# Patient Record
Sex: Female | Born: 1950 | Race: White | Hispanic: No | Marital: Single | Smoking: Former smoker
Health system: Southern US, Community
[De-identification: ages and names within clinical notes are randomized; demographics above are authoritative.]

## PROBLEM LIST (undated history)

## (undated) DIAGNOSIS — R0600 Dyspnea, unspecified: Secondary | ICD-10-CM

## (undated) DIAGNOSIS — H9190 Unspecified hearing loss, unspecified ear: Secondary | ICD-10-CM

## (undated) DIAGNOSIS — M1990 Unspecified osteoarthritis, unspecified site: Secondary | ICD-10-CM

## (undated) DIAGNOSIS — K219 Gastro-esophageal reflux disease without esophagitis: Secondary | ICD-10-CM

## (undated) DIAGNOSIS — G4730 Sleep apnea, unspecified: Secondary | ICD-10-CM

## (undated) DIAGNOSIS — F32A Depression, unspecified: Secondary | ICD-10-CM

## (undated) HISTORY — DX: Depression, unspecified: F32.A

## (undated) NOTE — Telephone Encounter (Signed)
 Formatting of this note might be different from the original. Pt called and wanted a prescription for supplies.  Pt has not been seen here in over 2 years and now lives in [place] .  Recommended that the pt establish with a Sleep provider and she can contact her PCP to see if they are willing to sign a prescription for supplies. Electronically signed by Arzella America, RT at 02/07/2024 10:08 AM CDT

---

## 1987-07-06 HISTORY — PX: EXPLORATORY LAPAROTOMY: SUR591

## 2015-05-19 DIAGNOSIS — Z9109 Other allergy status, other than to drugs and biological substances: Secondary | ICD-10-CM | POA: Insufficient documentation

## 2015-07-18 LAB — HM COLONOSCOPY

## 2015-08-05 DIAGNOSIS — Z1211 Encounter for screening for malignant neoplasm of colon: Secondary | ICD-10-CM | POA: Insufficient documentation

## 2015-08-05 LAB — HM COLONOSCOPY

## 2020-07-17 DIAGNOSIS — Z87891 Personal history of nicotine dependence: Secondary | ICD-10-CM | POA: Insufficient documentation

## 2020-07-17 DIAGNOSIS — H9190 Unspecified hearing loss, unspecified ear: Secondary | ICD-10-CM | POA: Insufficient documentation

## 2020-07-17 DIAGNOSIS — R0683 Snoring: Secondary | ICD-10-CM | POA: Insufficient documentation

## 2020-07-17 DIAGNOSIS — K635 Polyp of colon: Secondary | ICD-10-CM | POA: Insufficient documentation

## 2020-07-20 DIAGNOSIS — S82891D Other fracture of right lower leg, subsequent encounter for closed fracture with routine healing: Secondary | ICD-10-CM | POA: Insufficient documentation

## 2020-07-21 LAB — HM MAMMOGRAPHY

## 2020-08-04 HISTORY — PX: BREAST BIOPSY: SHX20

## 2020-08-06 DIAGNOSIS — N6019 Diffuse cystic mastopathy of unspecified breast: Secondary | ICD-10-CM | POA: Insufficient documentation

## 2020-10-31 DIAGNOSIS — I493 Ventricular premature depolarization: Secondary | ICD-10-CM | POA: Insufficient documentation

## 2021-09-16 ENCOUNTER — Other Ambulatory Visit: Payer: Self-pay | Admitting: Physician Assistant

## 2021-09-16 DIAGNOSIS — M12811 Other specific arthropathies, not elsewhere classified, right shoulder: Secondary | ICD-10-CM

## 2021-09-17 ENCOUNTER — Other Ambulatory Visit: Payer: Self-pay

## 2021-09-17 ENCOUNTER — Ambulatory Visit
Admission: RE | Admit: 2021-09-17 | Discharge: 2021-09-17 | Disposition: A | Discharge status: Home / Self Care | Payer: Medicare Other | Source: Ambulatory Visit | Attending: Physician Assistant | Admitting: Physician Assistant

## 2021-09-17 DIAGNOSIS — M12811 Other specific arthropathies, not elsewhere classified, right shoulder: Secondary | ICD-10-CM | POA: Diagnosis not present

## 2021-09-28 ENCOUNTER — Telehealth: Payer: Self-pay

## 2021-09-28 NOTE — Telephone Encounter (Signed)
Copied from CRM [id number]. Topic: Appointment Scheduling - Scheduling Inquiry for Clinic ?>> Sep 28, 2021 11:32 AM Elliot Gault wrote: ?Patient states due to a Book It glitch it is not fair her 10/22/2021 NPA has to be [facility] and would like to know if she can be worked in sooner then July. Patient has been placed on the wait list and apologized for the inconvenience ?

## 2021-10-22 ENCOUNTER — Ambulatory Visit: Payer: Self-pay | Admitting: Family Medicine

## 2021-10-28 DIAGNOSIS — M75121 Complete rotator cuff tear or rupture of right shoulder, not specified as traumatic: Secondary | ICD-10-CM | POA: Insufficient documentation

## 2021-10-28 DIAGNOSIS — M7581 Other shoulder lesions, right shoulder: Secondary | ICD-10-CM | POA: Insufficient documentation

## 2021-12-01 ENCOUNTER — Other Ambulatory Visit: Payer: Self-pay | Admitting: Surgery

## 2021-12-15 ENCOUNTER — Encounter
Admission: RE | Admit: 2021-12-15 | Discharge: 2021-12-15 | Disposition: A | Discharge status: Home / Self Care | Payer: Medicare Other | Source: Ambulatory Visit | Attending: Surgery | Admitting: Surgery

## 2021-12-15 ENCOUNTER — Other Ambulatory Visit: Payer: Self-pay

## 2021-12-15 DIAGNOSIS — I10 Essential (primary) hypertension: Secondary | ICD-10-CM

## 2021-12-15 HISTORY — DX: Dyspnea, unspecified: R06.00

## 2021-12-15 HISTORY — DX: Sleep apnea, unspecified: G47.30

## 2021-12-15 HISTORY — DX: Gastro-esophageal reflux disease without esophagitis: K21.9

## 2021-12-15 NOTE — Patient Instructions (Addendum)
Your procedure is scheduled on: 6/20 /2023 Report to the Registration Desk on the 1st floor of the [facility]. To find out your arrival time, please call [phone] between 1PM - 3PM on: 12/21/2021 If your arrival time is 6:00 am, do not arrive prior to that time as the [facility] entrance doors do not open until 6:00 am.  REMEMBER: Instructions that are not followed completely may result in serious medical risk, up to and including death; or upon the discretion of your surgeon and anesthesiologist your surgery may need to be rescheduled.  Do not eat food after midnight the night before surgery.  No gum chewing, lozengers or hard candies.  You may however, drink CLEAR liquids up to 2 hours before you are scheduled to arrive for your surgery. Do not drink anything within 2 hours of your scheduled arrival time.  Clear liquids include: - water  - apple juice without pulp - gatorade (not RED colors) - black coffee or tea (Do NOT add milk or creamers to the coffee or tea) Do NOT drink anything that is not on this list.  In addition, your doctor has ordered for you to drink the provided  Ensure Pre-Surgery Clear Carbohydrate Drink   Drinking this carbohydrate drink up to two hours before surgery helps to reduce insulin resistance and improve patient outcomes. Please complete drinking 2 hours prior to scheduled arrival time.  TAKE THESE MEDICATIONS THE MORNING OF SURGERY WITH A SIP OF WATER: claritin   Use inhalers on the day of surgery and bring to the hospital.  Follow the instructions given by your surgeon about Aspirin.  One week prior to surgery: Stop Anti-inflammatories (NSAIDS) such as Advil, Aleve, Ibuprofen, Motrin, Naproxen, Naprosyn and Aspirin based products such as Excedrin, Goodys Powder, BC Powder and Mobic (meloxicam) Stop ANY OVER THE COUNTER supplements until after surgery like Vit D, fish oil, multivitamins, garlic, You may however, continue to take Tylenol if  needed for pain up until the day of surgery.  No Alcohol for 24 hours before or after surgery.  No Smoking including e-cigarettes for 24 hours prior to surgery.  No chewable tobacco products for at least 6 hours prior to surgery.  No nicotine patches on the day of surgery.  Do not use any "recreational" drugs for at least a week prior to your surgery.  Please be advised that the combination of cocaine and anesthesia may have negative outcomes, up to and including death. If you test positive for cocaine, your surgery will be cancelled.  On the morning of surgery brush your teeth with toothpaste and water, you may rinse your mouth with mouthwash if you wish. Do not swallow any toothpaste or mouthwash.  Use CHG Soap as directed on instruction sheet-provided for you  Do not wear jewelry, make-up, hairpins, clips or nail polish.  Do not wear lotions, powders, or perfumes.   Do not shave body from the neck down 48 hours prior to surgery just in case you cut yourself which could leave a site for infection.  Also, freshly shaved skin may become irritated if using the CHG soap.  Contact lenses, hearing aids and dentures may not be worn into surgery.  Do not bring valuables to the hospital. [facility] is not responsible for any missing/lost belongings or valuables.   Bring your C-PAP to the hospital with you in case you may have to spend the night.   Notify your doctor if there is any change in your medical condition (  cold, fever, infection).  Wear comfortable clothing (specific to your surgery type) to the hospital.   After surgery, you can help prevent lung complications by doing breathing exercises.  Take deep breaths and cough every 1-2 hours. Your doctor may order a device called an Incentive Spirometer to help you take deep breaths. If you are being admitted to the hospital overnight, leave your suitcase in the car. After surgery it may be brought to your room.  If you are being  discharged the day of surgery, you will not be allowed to drive home. You will need a responsible adult (18 years or older) to drive you home and stay with you that night.   If you are taking public transportation, you will need to have a responsible adult (18 years or older) with you. Please confirm with your physician that it is acceptable to use public transportation.   Please call the Pre-admissions Testing Dept. at [phone] if you have any questions about these instructions.  Surgery Visitation Policy:  Patients undergoing a surgery or procedure may have two family members or support persons with them as long as the person is not COVID-19 positive or experiencing its symptoms.

## 2021-12-16 ENCOUNTER — Encounter
Admission: RE | Admit: 2021-12-16 | Discharge: 2021-12-16 | Disposition: A | Discharge status: Home / Self Care | Payer: Medicare Other | Source: Ambulatory Visit | Attending: Surgery | Admitting: Surgery

## 2021-12-16 ENCOUNTER — Encounter: Payer: Self-pay | Admitting: Urgent Care

## 2021-12-16 DIAGNOSIS — I10 Essential (primary) hypertension: Secondary | ICD-10-CM | POA: Insufficient documentation

## 2021-12-16 DIAGNOSIS — Z01818 Encounter for other preprocedural examination: Secondary | ICD-10-CM | POA: Insufficient documentation

## 2021-12-16 LAB — CBC
HCT: 42.8 % (ref 36.0–46.0)
Hemoglobin: 13.8 g/dL (ref 12.0–15.0)
MCH: 30.1 pg (ref 26.0–34.0)
MCHC: 32.2 g/dL (ref 30.0–36.0)
MCV: 93.4 fL (ref 80.0–100.0)
Platelets: 261 10*3/uL (ref 150–400)
RBC: 4.58 MIL/uL (ref 3.87–5.11)
RDW: 13 % (ref 11.5–15.5)
WBC: 7 10*3/uL (ref 4.0–10.5)
nRBC: 0 % (ref 0.0–0.2)

## 2021-12-17 ENCOUNTER — Ambulatory Visit: Payer: Self-pay | Admitting: Family Medicine

## 2021-12-21 MED ORDER — CHLORHEXIDINE GLUCONATE 0.12 % MT SOLN: 15.0000 mL | Freq: Once | OROMUCOSAL | Status: AC

## 2021-12-21 MED ORDER — ORAL CARE MOUTH RINSE: 15.0000 mL | Freq: Once | OROMUCOSAL | Status: AC

## 2021-12-21 MED ORDER — FAMOTIDINE 20 MG PO TABS: 20.0000 mg | ORAL_TABLET | Freq: Once | ORAL | Status: AC

## 2021-12-21 MED ORDER — LACTATED RINGERS IV SOLN: INTRAVENOUS | Status: DC

## 2021-12-21 MED ORDER — VANCOMYCIN HCL IN DEXTROSE 1-5 GM/200ML-% IV SOLN
1000.0000 mg | INTRAVENOUS | Status: AC
  Administered 2021-12-22: 1000 mg via INTRAVENOUS

## 2021-12-22 ENCOUNTER — Other Ambulatory Visit: Payer: Self-pay

## 2021-12-22 ENCOUNTER — Encounter: Payer: Self-pay | Admitting: Surgery

## 2021-12-22 ENCOUNTER — Encounter
Admission: RE | Disposition: A | Discharge status: Home / Self Care | Payer: Self-pay | Source: Home / Self Care | Attending: Surgery

## 2021-12-22 ENCOUNTER — Ambulatory Visit: Payer: Medicare Other

## 2021-12-22 ENCOUNTER — Ambulatory Visit
Admission: RE | Admit: 2021-12-22 | Discharge: 2021-12-22 | Disposition: A | Discharge status: Home / Self Care | Payer: Medicare Other | Attending: Surgery | Admitting: Surgery

## 2021-12-22 ENCOUNTER — Ambulatory Visit: Payer: Medicare Other | Admitting: Certified Registered"

## 2021-12-22 DIAGNOSIS — M75121 Complete rotator cuff tear or rupture of right shoulder, not specified as traumatic: Secondary | ICD-10-CM | POA: Insufficient documentation

## 2021-12-22 DIAGNOSIS — M25811 Other specified joint disorders, right shoulder: Secondary | ICD-10-CM | POA: Diagnosis present

## 2021-12-22 DIAGNOSIS — G4730 Sleep apnea, unspecified: Secondary | ICD-10-CM | POA: Diagnosis not present

## 2021-12-22 DIAGNOSIS — Z87891 Personal history of nicotine dependence: Secondary | ICD-10-CM | POA: Insufficient documentation

## 2021-12-22 DIAGNOSIS — K219 Gastro-esophageal reflux disease without esophagitis: Secondary | ICD-10-CM | POA: Diagnosis not present

## 2021-12-22 HISTORY — PX: SHOULDER ARTHROSCOPY WITH SUBACROMIAL DECOMPRESSION, ROTATOR CUFF REPAIR AND BICEP TENDON REPAIR: SHX5687

## 2021-12-22 SURGERY — SHOULDER ARTHROSCOPY WITH SUBACROMIAL DECOMPRESSION, ROTATOR CUFF REPAIR AND BICEP TENDON REPAIR
Anesthesia: General | Site: Shoulder | Laterality: Right

## 2021-12-22 MED ORDER — KETOROLAC TROMETHAMINE 15 MG/ML IJ SOLN
INTRAMUSCULAR | Status: AC
  Administered 2021-12-22: 15 mg via INTRAVENOUS
  Filled 2021-12-22: qty 1

## 2021-12-22 MED ORDER — VANCOMYCIN HCL IN DEXTROSE 1-5 GM/200ML-% IV SOLN
INTRAVENOUS | Status: AC
  Filled 2021-12-22: qty 200

## 2021-12-22 MED ORDER — OXYCODONE HCL 5 MG PO TABS: 5.0000 mg | ORAL_TABLET | ORAL | 0 refills | Status: DC | PRN

## 2021-12-22 MED ORDER — ACETAMINOPHEN 10 MG/ML IV SOLN
INTRAVENOUS | Status: DC | PRN
  Administered 2021-12-22: 1000 mg via INTRAVENOUS

## 2021-12-22 MED ORDER — OXYCODONE HCL 5 MG PO TABS
5.0000 mg | ORAL_TABLET | Freq: Once | ORAL | Status: AC
  Administered 2021-12-22: 5 mg via ORAL

## 2021-12-22 MED ORDER — FAMOTIDINE 20 MG PO TABS
ORAL_TABLET | ORAL | Status: AC
  Administered 2021-12-22: 20 mg via ORAL
  Filled 2021-12-22: qty 1

## 2021-12-22 MED ORDER — ONDANSETRON HCL 4 MG/2ML IJ SOLN: 4.0000 mg | Freq: Once | INTRAMUSCULAR | Status: DC | PRN

## 2021-12-22 MED ORDER — FENTANYL CITRATE (PF) 100 MCG/2ML IJ SOLN
INTRAMUSCULAR | Status: AC
  Filled 2021-12-22: qty 2

## 2021-12-22 MED ORDER — PHENYLEPHRINE 80 MCG/ML (10ML) SYRINGE FOR IV PUSH (FOR BLOOD PRESSURE SUPPORT)
PREFILLED_SYRINGE | INTRAVENOUS | Status: AC
  Filled 2021-12-22: qty 10

## 2021-12-22 MED ORDER — ONDANSETRON HCL 4 MG/2ML IJ SOLN
INTRAMUSCULAR | Status: AC
  Filled 2021-12-22: qty 2

## 2021-12-22 MED ORDER — BUPIVACAINE LIPOSOME 1.3 % IJ SUSP
INTRAMUSCULAR | Status: AC
  Filled 2021-12-22: qty 10

## 2021-12-22 MED ORDER — EPINEPHRINE PF 1 MG/ML IJ SOLN
INTRAMUSCULAR | Status: AC
  Filled 2021-12-22: qty 2

## 2021-12-22 MED ORDER — MIDAZOLAM HCL 2 MG/2ML IJ SOLN
INTRAMUSCULAR | Status: AC
  Filled 2021-12-22: qty 2

## 2021-12-22 MED ORDER — BUPIVACAINE HCL (PF) 0.5 % IJ SOLN
INTRAMUSCULAR | Status: AC
  Filled 2021-12-22: qty 20

## 2021-12-22 MED ORDER — EPHEDRINE SULFATE (PRESSORS) 50 MG/ML IJ SOLN
INTRAMUSCULAR | Status: DC | PRN
  Administered 2021-12-22 (×2): 5 mg via INTRAVENOUS

## 2021-12-22 MED ORDER — SUGAMMADEX SODIUM 200 MG/2ML IV SOLN
INTRAVENOUS | Status: DC | PRN
  Administered 2021-12-22: 200 mg via INTRAVENOUS

## 2021-12-22 MED ORDER — PROPOFOL 10 MG/ML IV BOLUS
INTRAVENOUS | Status: AC
Start: 2021-12-22 — End: ?
  Filled 2021-12-22: qty 20

## 2021-12-22 MED ORDER — FENTANYL CITRATE (PF) 100 MCG/2ML IJ SOLN: 25.0000 ug | INTRAMUSCULAR | Status: DC | PRN

## 2021-12-22 MED ORDER — PHENYLEPHRINE HCL (PRESSORS) 10 MG/ML IV SOLN
INTRAVENOUS | Status: DC | PRN
  Administered 2021-12-22: 160 ug via INTRAVENOUS
  Administered 2021-12-22: 80 ug via INTRAVENOUS
  Administered 2021-12-22: 160 ug via INTRAVENOUS

## 2021-12-22 MED ORDER — LACTATED RINGERS IR SOLN
Status: DC | PRN
  Administered 2021-12-22: 3001 mL

## 2021-12-22 MED ORDER — KETOROLAC TROMETHAMINE 15 MG/ML IJ SOLN: 15.0000 mg | Freq: Once | INTRAMUSCULAR | Status: AC

## 2021-12-22 MED ORDER — EPHEDRINE 5 MG/ML INJ
INTRAVENOUS | Status: AC
  Filled 2021-12-22: qty 5

## 2021-12-22 MED ORDER — ROCURONIUM BROMIDE 10 MG/ML (PF) SYRINGE
PREFILLED_SYRINGE | INTRAVENOUS | Status: AC
  Filled 2021-12-22: qty 10

## 2021-12-22 MED ORDER — MIDAZOLAM HCL 2 MG/2ML IJ SOLN
INTRAMUSCULAR | Status: AC
  Administered 2021-12-22: 2 mg via INTRAVENOUS
  Filled 2021-12-22: qty 2

## 2021-12-22 MED ORDER — BUPIVACAINE-EPINEPHRINE 0.5% -1:200000 IJ SOLN
INTRAMUSCULAR | Status: DC | PRN
  Administered 2021-12-22: 30 mL

## 2021-12-22 MED ORDER — FENTANYL CITRATE (PF) 100 MCG/2ML IJ SOLN
INTRAMUSCULAR | Status: DC | PRN
  Administered 2021-12-22 (×4): 25 ug via INTRAVENOUS

## 2021-12-22 MED ORDER — DEXAMETHASONE SODIUM PHOSPHATE 10 MG/ML IJ SOLN
INTRAMUSCULAR | Status: DC | PRN
  Administered 2021-12-22: 10 mg via INTRAVENOUS

## 2021-12-22 MED ORDER — BUPIVACAINE LIPOSOME 1.3 % IJ SUSP
INTRAMUSCULAR | Status: DC | PRN
  Administered 2021-12-22: 10 mL via PERINEURAL

## 2021-12-22 MED ORDER — BUPIVACAINE-EPINEPHRINE (PF) 0.5% -1:200000 IJ SOLN
INTRAMUSCULAR | Status: AC
Start: 2021-12-22 — End: ?
  Filled 2021-12-22: qty 30

## 2021-12-22 MED ORDER — ROCURONIUM BROMIDE 100 MG/10ML IV SOLN
INTRAVENOUS | Status: DC | PRN
  Administered 2021-12-22: 10 mg via INTRAVENOUS
  Administered 2021-12-22: 60 mg via INTRAVENOUS

## 2021-12-22 MED ORDER — CHLORHEXIDINE GLUCONATE 0.12 % MT SOLN
OROMUCOSAL | Status: AC
  Administered 2021-12-22: 15 mL via OROMUCOSAL
  Filled 2021-12-22: qty 15

## 2021-12-22 MED ORDER — BUPIVACAINE HCL (PF) 0.5 % IJ SOLN
INTRAMUSCULAR | Status: DC | PRN
  Administered 2021-12-22 (×2): 20 mL via PERINEURAL

## 2021-12-22 MED ORDER — 0.9 % SODIUM CHLORIDE (POUR BTL) OPTIME
TOPICAL | Status: DC | PRN
  Administered 2021-12-22: 1000 mL

## 2021-12-22 MED ORDER — ONDANSETRON HCL 4 MG/2ML IJ SOLN
INTRAMUSCULAR | Status: DC | PRN
  Administered 2021-12-22: 4 mg via INTRAVENOUS

## 2021-12-22 MED ORDER — LIDOCAINE HCL (CARDIAC) PF 100 MG/5ML IV SOSY
PREFILLED_SYRINGE | INTRAVENOUS | Status: DC | PRN
  Administered 2021-12-22: 100 mg via INTRAVENOUS

## 2021-12-22 MED ORDER — OXYCODONE HCL 5 MG PO TABS
ORAL_TABLET | ORAL | Status: AC
  Filled 2021-12-22: qty 1

## 2021-12-22 MED ORDER — PROPOFOL 10 MG/ML IV BOLUS
INTRAVENOUS | Status: DC | PRN
  Administered 2021-12-22: 100 mg via INTRAVENOUS

## 2021-12-22 MED ORDER — LIDOCAINE HCL (PF) 2 % IJ SOLN
INTRAMUSCULAR | Status: AC
  Filled 2021-12-22: qty 5

## 2021-12-22 MED ORDER — MIDAZOLAM HCL 2 MG/2ML IJ SOLN: 2.0000 mg | Freq: Once | INTRAMUSCULAR | Status: AC

## 2021-12-22 MED ORDER — ACETAMINOPHEN 10 MG/ML IV SOLN
INTRAVENOUS | Status: AC
Start: 2021-12-22 — End: ?
  Filled 2021-12-22: qty 100

## 2021-12-22 SURGICAL SUPPLY — 51 items
ANCHOR ALL-SUT Q-FIX 2.8 (Anchor) ×2 IMPLANT
ANCHOR HEALICOIL REGEN 5.5 (Anchor) ×2 IMPLANT
ANCHOR SUT JK SZ 2 2.9 DBL SL (Anchor) ×1 IMPLANT
BIT DRILL JUGRKNT W/NDL BIT2.9 (DRILL) IMPLANT
BLADE FULL RADIUS 3.5 (BLADE) ×2 IMPLANT
BUR ACROMIONIZER 4.0 (BURR) ×2 IMPLANT
CANNULA SHAVER 8MMX76MM (CANNULA) ×2 IMPLANT
CHLORAPREP W/TINT 26 (MISCELLANEOUS) ×2 IMPLANT
COVER MAYO STAND REUSABLE (DRAPES) ×2 IMPLANT
DILATOR 5.5 THREADED HEALICOIL (MISCELLANEOUS) ×1 IMPLANT
DRILL JUGGERKNOT W/NDL BIT 2.9 (DRILL) ×2
ELECT CAUTERY BLADE 6.4 (BLADE) ×2 IMPLANT
ELECT REM PT RETURN 9FT ADLT (ELECTROSURGICAL) ×2
ELECTRODE REM PT RTRN 9FT ADLT (ELECTROSURGICAL) ×1 IMPLANT
GAUZE SPONGE 4X4 12PLY STRL (GAUZE/BANDAGES/DRESSINGS) ×2 IMPLANT
GAUZE XEROFORM 1X8 LF (GAUZE/BANDAGES/DRESSINGS) ×2 IMPLANT
GLOVE BIO SURGEON STRL SZ7.5 (GLOVE) ×4 IMPLANT
GLOVE BIO SURGEON STRL SZ8 (GLOVE) ×4 IMPLANT
GLOVE BIOGEL PI IND STRL 8 (GLOVE) ×1 IMPLANT
GLOVE BIOGEL PI INDICATOR 8 (GLOVE) ×1
GLOVE SURG UNDER LTX SZ8 (GLOVE) ×2 IMPLANT
GOWN STRL REUS W/ TWL LRG LVL3 (GOWN DISPOSABLE) ×1 IMPLANT
GOWN STRL REUS W/ TWL XL LVL3 (GOWN DISPOSABLE) ×1 IMPLANT
GOWN STRL REUS W/TWL LRG LVL3 (GOWN DISPOSABLE) ×1
GOWN STRL REUS W/TWL XL LVL3 (GOWN DISPOSABLE) ×1
GRASPER SUT 15 45D LOW PRO (SUTURE) IMPLANT
IV LACTATED RINGER IRRG 3000ML (IV SOLUTION) ×2
IV LR IRRIG 3000ML ARTHROMATIC (IV SOLUTION) ×2 IMPLANT
KIT CANNULA 8X76-LX IN CANNULA (CANNULA) IMPLANT
KIT SUTURE 2.8 Q-FIX DISP (MISCELLANEOUS) ×1 IMPLANT
MANIFOLD NEPTUNE II (INSTRUMENTS) ×4 IMPLANT
MASK FACE SPIDER DISP (MASK) ×2 IMPLANT
MAT ABSORB  FLUID 56X50 GRAY (MISCELLANEOUS) ×1
MAT ABSORB FLUID 56X50 GRAY (MISCELLANEOUS) ×1 IMPLANT
PACK ARTHROSCOPY SHOULDER (MISCELLANEOUS) ×2 IMPLANT
PAD ABD DERMACEA PRESS 5X9 (GAUZE/BANDAGES/DRESSINGS) ×3 IMPLANT
PASSER SUT FIRSTPASS SELF (INSTRUMENTS) ×1 IMPLANT
SLING ARM LRG DEEP (SOFTGOODS) ×2 IMPLANT
SLING ULTRA II LG (MISCELLANEOUS) ×2 IMPLANT
SPONGE T-LAP 18X18 ~~LOC~~+RFID (SPONGE) ×2 IMPLANT
STAPLER SKIN PROX 35W (STAPLE) ×2 IMPLANT
STRAP SAFETY 5IN WIDE (MISCELLANEOUS) ×2 IMPLANT
SUT ETHIBOND 0 MO6 C/R (SUTURE) ×2 IMPLANT
SUT ULTRABRAID 2 COBRAID 38 (SUTURE) IMPLANT
SUT VIC AB 2-0 CT1 27 (SUTURE) ×2
SUT VIC AB 2-0 CT1 TAPERPNT 27 (SUTURE) ×2 IMPLANT
TAPE MICROFOAM 4IN (TAPE) ×2 IMPLANT
TUBING CONNECTING 10 (TUBING) ×2 IMPLANT
TUBING INFLOW SET DBFLO PUMP (TUBING) ×2 IMPLANT
WAND WEREWOLF FLOW 90D (MISCELLANEOUS) ×2 IMPLANT
WATER STERILE IRR 500ML POUR (IV SOLUTION) ×2 IMPLANT

## 2021-12-22 NOTE — H&P (Signed)
History of Present Illness:  Tara Olson is a 71 y.o. female who presents today for history and physical for an upcoming right shoulder arthroscopy with debridement, decompression, rotator cuff repair, and probable biceps tenodesis to be done by Dr. Joice Lofts on December 22, 2021.  The patient's symptoms began over a year ago and developed without any specific cause or injury. At the time, the patient was living in [place] so she received treatment up there, including a course of physical therapy and several steroid injections which provided temporary partial relief of her symptoms. More recently, she has moved down to the [place] area to be closer to her grandchildren. Because of continued pain in her shoulder, she saw Van Clines, PA, who sent her for an MRI scan of the shoulder and referred her to me for further evaluation and treatment.   The patient describes the symptoms as moderate (patient is active but has had to make modifications or give up activities) and have the quality of being aching, nagging, stabbing and tender. The pain is localized to the lateral arm/shoulder. These symptoms are aggravated with normal daily activities, with sleeping, at higher levels of activity, with overhead activity and reaching behind the back. She has tried acetaminophen and non-steroidal anti-inflammatories (Meloxicam) with temporary partial relief. She has tried physical therapy , rest and ice with limited benefit. She has tried the 2 injections described above, also with temporary partial relief of her symptoms . The patient denies any neck pain, nor does she note any numbness or paresthesias down her arm to her hand. This complaint is not work related. She is a sports non-participant.  Shoulder Surgical History:  The patient has had no shoulder surgery in the past.  PMH/PSH/Family History/Social History/Meds/Allergies:  I have reviewed past medical, surgical, social and family history, medications and  allergies as documented in the EMR.  Current Outpatient Medications: acetaminophen (TYLENOL) 500 MG tablet Take by mouth  albuterol 90 mcg/actuation inhaler Inhale into the lungs every 4 (four) hours as needed  aspirin 81 MG EC tablet Take 81 mg by mouth once daily  calcium carbonate-vitamin D3 (OS-CAL 500+D) 500 mg-10 mcg (400 unit) tablet Take 1 tablet by mouth 2 (two) times daily with meals  cetirizine (ZYRTEC) 10 mg capsule Take 10 mg by mouth once daily  fluticasone propionate (FLONASE) 50 mcg/actuation nasal spray Place 2 sprays into both nostrils once daily  Herbal Supplement garlic  meloxicam (MOBIC) 7.5 MG tablet Take 1 tablet (7.5 mg total) by mouth 2 (two) times daily 60 tablet 2  multivitamin tablet Take 1 tablet by mouth once daily  omega-3s/dha/epa/fish oil (OMEGA 3 ORAL) Take by mouth   Allergies:  Penicillins Anaphylaxis  Hydrocodone-Acetaminophen Other (Psychiatric)  Past Medical History:  Closed fracture dislocation of right ankle (07/20/2020)  Environmental allergies 05/19/2015  Former smoker 07/17/2020  Hearing loss 07/17/2020  Osteoarthritis  Polyp of colon 07/17/2020  PVC's (premature ventricular contractions) 10/31/2020  Sleep apnea (Snoring 07/17/2020)   Past Surgical History:  Laproscopy 07/05/1981  BREAST BIOPSY, NEEDLE CORE Right 2014  Bx done in [place] , negative  COLONOSCOPY WITH POLYPECTOMY 08/05/2015  Tubular adenoma polyp removed. Next colonoscopy advised in 5 yrs.  US BREAST RIGHT BIOPSY Right 08/04/2020  US BREAST RIGHT BIOPSY AFF STO ULTRASOUND  ENDOSCOPY, COLON, DIAGNOSTIC  TONSILLECTOMY & ADENOIDECTOMY   Family History:  High blood pressure (Hypertension) Father  Diabetes type II Father  Coronary Artery Disease (Blocked arteries around heart) Father  Prostate cancer Brother  Coronary Artery Disease (  Blocked arteries around heart) Brother  Sleep apnea Brother  Breast cancer Maternal Aunt  Breast cancer Paternal Aunt   Social History:    Socioeconomic History:  Marital status: Single  Tobacco Use  Smoking status: Former  Types: Cigarettes  Quit date: 04/06/2002  Years since quitting: 19.6  Smokeless tobacco: Never  Vaping Use  Vaping Use: Never used   Review of Systems:  A comprehensive 14 point ROS was performed, reviewed, and the pertinent orthopaedic findings are documented in the HPI.  Physical Exam:  Vitals:  12/09/21 1115  BP: 137/89  Pulse: 81  Weight: 81.2 kg (179 lb)  Height: 162.6 cm (5\' 4" )  PainSc: 0-No pain  PainLoc: Shoulder   General/Constitutional: The patient appears to be well-nourished, well-developed, and in no acute distress. Neuro/Psych: Normal mood and affect, oriented to person, place and time. Eyes: Non-icteric. Pupils are equal, round, and reactive to light, and exhibit synchronous movement. ENT: Unremarkable. Lymphatic: No palpable adenopathy. Respiratory: Lungs clear to auscultation, Normal chest excursion, No wheezes and Non-labored breathing Cardiovascular: Regular rate and rhythm. No murmurs. and No edema, swelling or tenderness, except as noted in detailed exam. Integumentary: No impressive skin lesions present, except as noted in detailed exam. Musculoskeletal: Unremarkable, except as noted in detailed exam.  Heart: Examination of the heart reveals regular, rate, and rhythm. There is no murmur noted on ascultation. There is a normal apical pulse.  Lungs: Lungs are clear to auscultation. There is no wheeze, rhonchi, or crackles. There is normal expansion of bilateral chest walls.   Right shoulder exam: SKIN: normal SWELLING: none WARMTH: none LYMPH NODES: no adenopathy palpable CREPITUS: none TENDERNESS: Mildly tender over anterior shoulder region more so than anterolateral acromial region ROM (active):  Forward flexion: 160 degrees Abduction: 155 degrees Internal rotation: Right PSIS ROM (passive):  Forward flexion: 165 degrees Abduction: 160 degrees  ER/IR at  90 abd: 90 degrees / 50 degrees  She has mild-moderate pain with internal rotation, and mild pain with forward flexion, abduction, and at the extremes of internal and external rotation at 90 degrees of abduction.  STRENGTH: Forward flexion: 4-4+/5 Abduction: 4-4+/5 External rotation: 4-4+/5 Internal rotation: 4+/5 Pain with RC testing: Mild pain with resisted forward flexion and abduction  STABILITY: Normal  SPECIAL TESTS: ' test: positive, mild Speed's test: positive Capsulitis - pain w/ passive ER: no Crossed arm test: no Crank: Not evaluated Anterior apprehension: Negative Posterior apprehension: Not evaluated   She is neurovascularly intact to the right upper extremity.  Shoulder X-Ray Imaging: Recent true AP and Y-scapular views of the right shoulder are available for review and have been reviewed by myself. These films demonstrate mild degenerative changes of the glenohumeral joint as manifest by a small inferior humeral osteophyte. The subacromial space is mildly decreased. There is no subacromial or infra-clavicular spurring. She demonstrates a Type II-III acromion.  Right Shoulder Imaging: MRI Shoulder Cartilage: Partial thickness humeral head cartilage loss. Partial thickness glenoid cartilage loss. MRI Shoulder Rotator Cuff: Full thickness tear of the supraspinatus. No retraction. MRI Shoulder Labrum / Biceps: No labral tear or biceps abormality. MRI Shoulder Bone: Normal bone.  Both the films and report were reviewed by myself and discussed with the patient.  Assessment:  1. Nontraumatic complete tear of right rotator cuff.  2. Rotator cuff tendinitis, right.   Plan:  The treatment options were discussed with the patient. In addition, patient educational materials were provided regarding the diagnosis and treatment options. The patient is quite frustrated by her  symptoms and functional limitations, and is ready to consider more aggressive treatment options.  Therefore, I have recommended a surgical procedure, specifically a right shoulder arthroscopy with debridement, decompression, rotator cuff repair, and probable biceps tenodesis. The procedure was discussed with the patient, as were the potential risks (including bleeding, infection, nerve and/or blood vessel injury, persistent or recurrent pain, failure of the repair, progression of arthritis, need for further surgery, blood clots, strokes, heart attacks and/or arhythmias, pneumonia, etc.) and benefits. The patient states her understanding and wishes to proceed. All of the patient's questions and concerns were answered. She can call any time with further concerns. She will follow up post-surgery, routine.   H&P reviewed and patient re-examined. No changes.

## 2021-12-22 NOTE — Anesthesia Preprocedure Evaluation (Signed)
Anesthesia Evaluation  Patient identified by MRN, date of birth, ID band Patient awake    Reviewed: Allergy & Precautions, H&P , NPO status , Patient's Chart, lab work & pertinent test results, reviewed documented beta blocker date and time   Airway Mallampati: II  TM Distance: >3 FB Neck ROM: full    Dental  (+) Teeth Intact   Pulmonary shortness of breath, sleep apnea ,    Pulmonary exam normal        Cardiovascular Exercise Tolerance: Good negative cardio ROS Normal cardiovascular exam Rhythm:regular Rate:Normal     Neuro/Psych negative neurological ROS  negative psych ROS   GI/Hepatic Neg liver ROS, GERD  Medicated,  Endo/Other  negative endocrine ROS  Renal/GU negative Renal ROS  negative genitourinary   Musculoskeletal   Abdominal   Peds  Hematology negative hematology ROS (+)   Anesthesia Other Findings Past Medical History: No date: Dyspnea No date: GERD (gastroesophageal reflux disease) No date: Sleep apnea History reviewed. No pertinent surgical history. BMI    Body Mass Index: 30.04 kg/m     Reproductive/Obstetrics negative OB ROS                             Anesthesia Physical Anesthesia Plan  ASA: 3  Anesthesia Plan: General ETT   Post-op Pain Management: Regional block*   Induction:   PONV Risk Score and Plan: 4 or greater  Airway Management Planned:   Additional Equipment:   Intra-op Plan:   Post-operative Plan:   Informed Consent: I have reviewed the patients History and Physical, chart, labs and discussed the procedure including the risks, benefits and alternatives for the proposed anesthesia with the patient or authorized representative who has indicated his/her understanding and acceptance.     Dental Advisory Given  Plan Discussed with: CRNA  Anesthesia Plan Comments:         Anesthesia Quick Evaluation

## 2021-12-22 NOTE — Transfer of Care (Signed)
Immediate Anesthesia Transfer of Care Note  Patient: Tara Olson  Procedure(s) Performed: RIGHT SHOULDER ARTHROSCOPY WITH DEBRIDEMENT, DECOMPRESSION, ROTATOR CUFF REPAIR, AND BICEPS TENODESIS (Right: Shoulder)  Patient Location: PACU  Anesthesia Type:General  Level of Consciousness: awake  Airway & Oxygen Therapy: Patient Spontanous Breathing and Patient connected to face mask oxygen  Post-op Assessment: Report given to RN and Post -op Vital signs reviewed and stable  Post vital signs: Reviewed and stable  Last Vitals:  Vitals Value Taken Time  BP 130/63 12/22/21 0931  Temp 35.9 0931  Pulse 88 12/22/21 0938  Resp 15 12/22/21 0938  SpO2 99 % 12/22/21 0938  Vitals shown include unvalidated device data.  Last Pain:  Vitals:   12/22/21 0628  TempSrc: Temporal  PainSc: 0-No pain         Complications: No notable events documented.

## 2021-12-22 NOTE — Discharge Instructions (Addendum)
Orthopedic discharge instructions: Keep dressing dry and intact.  May shower after dressing changed on post-op day #4 (Saturday).  Cover staples with Band-Aids after drying off. Apply ice frequently to shoulder. Take ibuprofen 600-800 mg TID with meals for 7-10 days, then as necessary. Take oxycodone as prescribed when needed.  May supplement with ES Tylenol if necessary. Keep shoulder immobilizer on at all times except may remove for bathing purposes. Follow-up in 10-14 days or as scheduled.AMBULATORY SURGERY  DISCHARGE INSTRUCTIONS   The drugs that you were given will stay in your system until tomorrow so for the next 24 hours you should not:  Drive an automobile Make any legal decisions Drink any alcoholic beverage   You may resume regular meals tomorrow.  Today it is better to start with liquids and gradually work up to solid foods.  You may eat anything you prefer, but it is better to start with liquids, then soup and crackers, and gradually work up to solid foods.   Please notify your doctor immediately if you have any unusual bleeding, trouble breathing, redness and pain at the surgery site, drainage, fever, or pain not relieved by medication.    Additional Instructions:        Please contact your physician with any problems or Same Day Surgery at [phone], Monday through Friday 6 am to 4 pm, or [facility] Main number at [phone].  

## 2021-12-22 NOTE — Anesthesia Procedure Notes (Signed)
Procedure Name: Intubation Date/Time: 12/22/2021 7:45 AM  Performed by: Morene Crocker, CRNAPre-anesthesia Checklist: Patient identified, Patient being monitored, Timeout performed, Emergency Drugs available and Suction available Patient Re-evaluated:Patient Re-evaluated prior to induction Oxygen Delivery Method: Circle system utilized Preoxygenation: Pre-oxygenation with 100% oxygen Induction Type: IV induction Ventilation: Mask ventilation without difficulty Laryngoscope Size: 3 and McGraph Grade View: Grade I Tube type: Oral Tube size: 7.0 mm Number of attempts: 1 Airway Equipment and Method: Stylet Placement Confirmation: ETT inserted through vocal cords under direct vision, positive ETCO2 and breath sounds checked- equal and bilateral Secured at: 22 cm Tube secured with: Tape Dental Injury: Teeth and Oropharynx as per pre-operative assessment

## 2021-12-22 NOTE — Anesthesia Procedure Notes (Signed)
Anesthesia Regional Block: Interscalene brachial plexus block   Pre-Anesthetic Checklist: , timeout performed,  Correct Patient, Correct Site, Correct Laterality,  Correct Procedure, Correct Position, site marked,  Risks and benefits discussed,  Surgical consent,  Pre-op evaluation,  At surgeon's request and post-op pain management  Laterality: Upper and Right  Prep: chloraprep       Needles:  Injection technique: Single-shot  Needle Type: Echogenic Stimulator Needle     Needle Length: 10cm  Needle Gauge: 22     Additional Needles:   Procedures:,,,, ultrasound used (permanent image in chart),,    Narrative:  Start time: 12/22/2021 7:31 AM End time: 12/22/2021 7:33 AM  Performed by: Personally  Anesthesiologist: Yevette Edwards, MD  Additional Notes: Pt. Identified and accepting of procedure after risks and benefits fully reviewed and questions answered. Time out performed and laterality confirmed prior to procedure.  ISNB  performed without difficulty and well tolerated.  Neg IV and SATD.  No pain on injection of Local anesthetic and VSST.

## 2021-12-22 NOTE — Op Note (Signed)
12/22/2021  9:43 AM  Patient:   Tara Olson  Pre-Op Diagnosis:   Impingement/tendinopathy with rotator cuff tear, right shoulder.  Post-Op Diagnosis:   Impingement/tendinopathy with rotator cuff tear and biceps tendinopathy, right shoulder.  Procedure:   Limited arthroscopic debridement, arthroscopic subacromial decompression, mini-open rotator cuff repair, and mini-open biceps tenodesis, right shoulder.  Anesthesia:   General endotracheal with interscalene block using Exparel placed preoperatively by the anesthesiologist.  Surgeon:   Maryagnes Amos, MD  Assistant:   Horris Latino, PA-C  Findings:   As above.  There was a full-thickness tear involving the anterior and middle insertional fibers of the supraspinatus tendon.  The remainder of the rotator cuff was in satisfactory condition.  There was some moderate "lip sticking" of the biceps tendon without partial or full-thickness tearing.  The articular surfaces of the glenoid and humerus both were in satisfactory condition, as well as the glenoid labrum.  Complications:   None  Fluids:   500 cc  Estimated blood loss:   10 cc  Tourniquet time:   None  Drains:   None  Closure:   Staples      Brief clinical note:   The patient is a 71 year old female with a history of progressively worsening right shoulder pain. The patient's symptoms have progressed despite medications, activity modification, etc. The patient's history and examination are consistent with impingement/tendinopathy with a rotator cuff tear. These findings were confirmed by MRI scan. The patient presents at this time for definitive management of these shoulder symptoms.  Procedure:   The patient underwent placement of an interscalene block using Exparel by the anesthesiologist in the preoperative holding area before being brought into the operating room and lain in the supine position. The patient then underwent general endotracheal intubation and anesthesia before  being repositioned in the beach chair position using the beach chair positioner. The right shoulder and upper extremity were prepped with ChloraPrep solution before being draped sterilely. Preoperative antibiotics were administered. A timeout was performed to confirm the proper surgical site before the expected portal sites and incision site were injected with 0.5% Sensorcaine with epinephrine.   A posterior portal was created and the glenohumeral joint thoroughly inspected with the findings as described above. An anterior portal was created using an outside-in technique. The labrum and rotator cuff were further probed, again confirming the above-noted findings. Areas of synovitis as well as the torn margin of the supraspinatus tendon were debrided back to stable margins using the full-radius resector. The ArthroCare wand was inserted and used to release the biceps tendon from its labral anchor.  It also was used to obtain hemostasis. The instruments were removed from the joint after suctioning the excess fluid.  The camera was repositioned through the posterior portal into the subacromial space. A separate lateral portal was created using an outside-in technique. The 3.5 mm full-radius resector was introduced and used to perform a subtotal bursectomy. The ArthroCare wand was then inserted and used to remove the periosteal tissue off the undersurface of the anterior third of the acromion as well as to recess the coracoacromial ligament from its attachment along the anterior and lateral margins of the acromion. The 4.0 mm acromionizing bur was introduced and used to complete the decompression by removing the undersurface of the anterior third of the acromion. The full radius resector was reintroduced to remove any residual bony debris before the ArthroCare wand was reintroduced to obtain hemostasis. The instruments were then removed from the subacromial space after suctioning  the excess fluid.  An approximately  4-5 cm incision was made over the anterolateral aspect of the shoulder beginning at the anterolateral corner of the acromion and extending distally in line with the bicipital groove. This incision was carried down through the subcutaneous tissues to expose the deltoid fascia. The raphae between the anterior and middle thirds was identified and this plane developed to provide access into the subacromial space. Additional bursal tissues were debrided sharply using Metzenbaum scissors. The rotator cuff tear was readily identified. The margins were debrided sharply with a #15 blade and the exposed greater tuberosity roughened with a rongeur. The tear was repaired using two Smith [employer]hew 2.9 mm Q-Fix anchors. These sutures were then brought back laterally and secured using two Smith & Nephew Healicoil knotless RegeneSorb anchors to create a two-layer closure. An apparent watertight closure was obtained.  The bicipital groove was identified by palpation and opened for 1-1.5 cm. The biceps tendon stump was retrieved through this defect. The floor of the bicipital groove was roughened with a curet before a single Biomet 2.9 mm JuggerKnot anchor was inserted. Both sets of sutures were passed through the biceps tendon and tied securely to effect the tenodesis. The bicipital sheath was reapproximated using two #0 Ethibond interrupted sutures, incorporating the biceps tendon to further reinforce the tenodesis.  The wound was copiously irrigated with sterile saline solution before the deltoid raphae was reapproximated using 2-0 Vicryl interrupted sutures. The subcutaneous tissues were closed in two layers using 2-0 Vicryl interrupted sutures before the skin was closed using staples. The portal sites also were closed using staples. A sterile bulky dressing was applied to the shoulder before the arm was placed into a shoulder immobilizer. The patient was then awakened, extubated, and returned to the recovery room in  satisfactory condition after tolerating the procedure well.

## 2021-12-23 ENCOUNTER — Encounter: Payer: Self-pay | Admitting: Surgery

## 2021-12-23 DIAGNOSIS — M7521 Bicipital tendinitis, right shoulder: Secondary | ICD-10-CM | POA: Insufficient documentation

## 2021-12-23 NOTE — Anesthesia Postprocedure Evaluation (Signed)
Anesthesia Post Note  Patient: Pharmacist, hospital  Procedure(s) Performed: RIGHT SHOULDER ARTHROSCOPY WITH DEBRIDEMENT, DECOMPRESSION, ROTATOR CUFF REPAIR, AND BICEPS TENODESIS (Right: Shoulder)  Patient location during evaluation: PACU Anesthesia Type: General Level of consciousness: awake and alert Pain management: pain level controlled Vital Signs Assessment: post-procedure vital signs reviewed and stable Respiratory status: spontaneous breathing, nonlabored ventilation, respiratory function stable and patient connected to nasal cannula oxygen Cardiovascular status: blood pressure returned to baseline and stable Postop Assessment: no apparent nausea or vomiting Anesthetic complications: no   No notable events documented.   Last Vitals:  Vitals:   12/22/21 1019 12/22/21 1053  BP: 129/71 (!) 145/77  Pulse: 84 83  Resp: 16 16  Temp: (!) 36.2 C   SpO2: 94% 94%    Last Pain:  Vitals:   12/22/21 1053  TempSrc:   PainSc: 5                  Yevette Edwards

## 2022-01-21 ENCOUNTER — Encounter: Payer: Self-pay | Admitting: Family Medicine

## 2022-01-21 ENCOUNTER — Ambulatory Visit (INDEPENDENT_AMBULATORY_CARE_PROVIDER_SITE_OTHER): Payer: Medicare Other | Admitting: Family Medicine

## 2022-01-21 VITALS — BP 138/80 | HR 84 | Ht 64.0 in | Wt 176.0 lb

## 2022-01-21 DIAGNOSIS — F4323 Adjustment disorder with mixed anxiety and depressed mood: Secondary | ICD-10-CM

## 2022-01-21 DIAGNOSIS — Z23 Encounter for immunization: Secondary | ICD-10-CM | POA: Diagnosis not present

## 2022-01-21 MED ORDER — ESCITALOPRAM OXALATE 10 MG PO TABS: 10.0000 mg | ORAL_TABLET | Freq: Every day | ORAL | 1 refills | Status: DC

## 2022-01-21 NOTE — Progress Notes (Signed)
Date:  01/21/2022   Name:  Tara Olson   DOB:  [birth year]   MRN:  [id number]   Chief Complaint: pneumonia vaccine, Establish Care, and Depression (Use to take lexapro)  Patient is a 71 year old female who presents for an establish care exam. The patient reports the following problems: depression history. Health maintenance has been reviewed needs mammogram/colonoscopy.    Depression        This is a chronic problem.  The current episode started more than 1 year ago.   Associated symptoms include irritable and decreased interest.  Associated symptoms include no decreased concentration, no hopelessness, does not have insomnia, no headaches, not sad and no suicidal ideas.  Past treatments include SSRIs - Selective serotonin reuptake inhibitors (previous on lexapro).  Compliance with treatment is good.  Previous treatment provided mild relief.   No results found for: "NA", "K", "CO2", "GLUCOSE", "BUN", "CREATININE", "CALCIUM", "EGFR", "GFRNONAA" No results found for: "CHOL", "HDL", "LDLCALC", "LDLDIRECT", "TRIG", "CHOLHDL" No results found for: "TSH" No results found for: "HGBA1C" Lab Results  Component Value Date   WBC 7.0 12/16/2021   HGB 13.8 12/16/2021   HCT 42.8 12/16/2021   MCV 93.4 12/16/2021   PLT 261 12/16/2021   No results found for: "ALT", "AST", "GGT", "ALKPHOS", "BILITOT" No results found for: "25OHVITD2", "25OHVITD3", "VD25OH"   Review of Systems  Eyes:  Positive for visual disturbance.       Transient visual disturbances  Cardiovascular:  Positive for leg swelling.  Skin:  Positive for rash.  Neurological:  Negative for headaches.  Psychiatric/Behavioral:  Positive for depression. Negative for decreased concentration and suicidal ideas. The patient does not have insomnia.     There are no problems to display for this patient.   Allergies  Allergen Reactions   Hydrocodone-Acetaminophen     Allergic to VICODIN   Penicillins     Past Surgical History:   Procedure Laterality Date   SHOULDER ARTHROSCOPY WITH SUBACROMIAL DECOMPRESSION, ROTATOR CUFF REPAIR AND BICEP TENDON REPAIR Right 12/22/2021   Procedure: RIGHT SHOULDER ARTHROSCOPY WITH DEBRIDEMENT, DECOMPRESSION, ROTATOR CUFF REPAIR, AND BICEPS TENODESIS;  Surgeon: Corky Mull, MD;  Location: [facility] ORS;  Service: Orthopedics;  Laterality: Right;    Social History   Tobacco Use   Smoking status: Former    Packs/day: 1.00    Years: 35.00    Total pack years: 35.00    Types: Cigarettes    Quit date: 07/05/2004    Years since quitting: 17.5   Smokeless tobacco: Never  Vaping Use   Vaping Use: Never used  Substance Use Topics   Alcohol use: Yes    Alcohol/week: 3.0 standard drinks of alcohol    Types: 3 Glasses of wine per week   Drug use: Never     Medication list has been reviewed and updated.  Current Meds  Medication Sig   acetaminophen (TYLENOL) 325 MG tablet Take 650 mg by mouth every 6 (six) hours as needed.   aspirin EC 81 MG tablet Take 81 mg by mouth daily. Swallow whole.   Calcium Carbonate-Vit D-Min (CALCIUM 1200 PO) Take by mouth.   Garlic 10 MG CAPS garlic   ibuprofen (ADVIL) 200 MG tablet Take 200 mg by mouth every 8 (eight) hours as needed for mild pain.   loratadine (CLARITIN) 10 MG tablet Take 10 mg by mouth daily.   Multiple Vitamin (MULTIVITAMIN) capsule Take 1 capsule by mouth daily.   Omega-3 Fatty Acids (FISH OIL) 1000 MG CAPS  Take 1 capsule by mouth daily.       01/21/2022    1:38 PM  GAD 7 : Generalized Anxiety Score  Nervous, Anxious, on Edge 0  Control/stop worrying 0  Worry too much - different things 1  Trouble relaxing 0  Restless 0  Easily annoyed or irritable 0  Afraid - awful might happen 1  Total GAD 7 Score 2  Anxiety Difficulty Not difficult at all       01/21/2022    1:38 PM  Depression screen PHQ 2/9  Decreased Interest 2  Down, Depressed, Hopeless 1  PHQ - 2 Score 3  Altered sleeping 1  Tired, decreased energy 1  Change  in appetite 1  Feeling bad or failure about yourself  0  Trouble concentrating 1  Moving slowly or fidgety/restless 1  Suicidal thoughts 0  PHQ-9 Score 8  Difficult doing work/chores Not difficult at all    BP Readings from Last 3 Encounters:  01/21/22 138/80  12/22/21 (!) 145/77    Physical Exam Vitals and nursing note reviewed. Exam conducted with a chaperone present.  Constitutional:      General: She is irritable. She is not in acute distress.    Appearance: She is not diaphoretic.  HENT:     Head: Normocephalic and atraumatic.     Right Ear: Tympanic membrane and external ear normal.     Left Ear: Tympanic membrane and external ear normal.     Nose: Nose normal.     Mouth/Throat:     Mouth: Mucous membranes are moist.  Eyes:     General: Vision grossly intact.        Right eye: No discharge.        Left eye: No discharge.     Extraocular Movements: Extraocular movements intact.     Conjunctiva/sclera: Conjunctivae normal.     Pupils: Pupils are equal, round, and reactive to light.  Neck:     Thyroid: No thyromegaly or thyroid tenderness.     Vascular: No JVD.  Cardiovascular:     Rate and Rhythm: Normal rate and regular rhythm.     Pulses: Normal pulses.     Heart sounds: Normal heart sounds. No murmur heard.    No friction rub. No gallop.  Pulmonary:     Effort: Pulmonary effort is normal.     Breath sounds: Normal breath sounds. No wheezing, rhonchi or rales.  Abdominal:     General: Bowel sounds are normal.     Palpations: Abdomen is soft. There is no hepatomegaly, splenomegaly or mass.  Musculoskeletal:        General: Normal range of motion.  Lymphadenopathy:     Cervical: No cervical adenopathy.  Skin:    General: Skin is warm and dry.  Neurological:     Mental Status: She is alert.     Wt Readings from Last 3 Encounters:  01/21/22 176 lb (79.8 kg)  12/22/21 175 lb (79.4 kg)    BP 138/80   Pulse 84   Ht 5' 4"  (1.626 m)   Wt 176 lb (79.8 kg)    BMI 30.21 kg/m   Assessment and Plan:  1. 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  Since moving to [place] patient has had increased and depression scoring with a PHQ of 8 and a gad score of 2.  Previously was on Lexapro 10 mg and we will resume that at that dosing.  Patient will return if need  for any adjustments in the future.

## 2022-01-25 ENCOUNTER — Ambulatory Visit: Payer: Self-pay | Admitting: Family Medicine

## 2022-03-30 ENCOUNTER — Ambulatory Visit (INDEPENDENT_AMBULATORY_CARE_PROVIDER_SITE_OTHER): Payer: Medicare Other | Admitting: Family Medicine

## 2022-03-30 ENCOUNTER — Encounter: Payer: Self-pay | Admitting: Family Medicine

## 2022-03-30 ENCOUNTER — Telehealth: Payer: Self-pay

## 2022-03-30 VITALS — BP 130/80 | HR 80 | Ht 64.0 in | Wt 175.0 lb

## 2022-03-30 DIAGNOSIS — Z1231 Encounter for screening mammogram for malignant neoplasm of breast: Secondary | ICD-10-CM | POA: Diagnosis not present

## 2022-03-30 DIAGNOSIS — Z1211 Encounter for screening for malignant neoplasm of colon: Secondary | ICD-10-CM | POA: Diagnosis not present

## 2022-03-30 DIAGNOSIS — Z23 Encounter for immunization: Secondary | ICD-10-CM | POA: Diagnosis not present

## 2022-03-30 DIAGNOSIS — Z0000 Encounter for general adult medical examination without abnormal findings: Secondary | ICD-10-CM | POA: Diagnosis not present

## 2022-03-30 DIAGNOSIS — Z780 Asymptomatic menopausal state: Secondary | ICD-10-CM

## 2022-03-30 NOTE — Progress Notes (Signed)
Date:  03/30/2022   Name:  Tara Olson   DOB:  Sep 22, 1950   MRN:  [id number]   Chief Complaint: Annual Exam  Patient is a 71 year old female who presents for a comprehensive physical exam. The patient reports the following problems: none. Health maintenance has been reviewed mammogram/colonoscopy      No results found for: "NA", "K", "CO2", "GLUCOSE", "BUN", "CREATININE", "CALCIUM", "EGFR", "GFRNONAA" No results found for: "CHOL", "HDL", "LDLCALC", "LDLDIRECT", "TRIG", "CHOLHDL" No results found for: "TSH" No results found for: "HGBA1C" Lab Results  Component Value Date   WBC 7.0 12/16/2021   HGB 13.8 12/16/2021   HCT 42.8 12/16/2021   MCV 93.4 12/16/2021   PLT 261 12/16/2021   No results found for: "ALT", "AST", "GGT", "ALKPHOS", "BILITOT" No results found for: "25OHVITD2", "25OHVITD3", "VD25OH"   Review of Systems  Constitutional:  Negative for chills and fever.  HENT:  Negative for drooling, ear discharge, ear pain and sore throat.   Respiratory:  Negative for cough, shortness of breath and wheezing.   Cardiovascular:  Negative for chest pain, palpitations and leg swelling.  Gastrointestinal:  Negative for abdominal pain, blood in stool, constipation, diarrhea and nausea.  Endocrine: Negative for polydipsia.  Genitourinary:  Negative for dysuria, frequency, hematuria and urgency.  Musculoskeletal:  Negative for back pain, myalgias and neck pain.  Skin:  Negative for rash.  Allergic/Immunologic: Negative for environmental allergies.  Neurological:  Negative for dizziness and headaches.  Hematological:  Does not bruise/bleed easily.  Psychiatric/Behavioral:  Negative for suicidal ideas. The patient is not nervous/anxious.     There are no problems to display for this patient.   Allergies  Allergen Reactions   Hydrocodone-Acetaminophen     Allergic to VICODIN   Penicillins     Past Surgical History:  Procedure Laterality Date   SHOULDER ARTHROSCOPY WITH  SUBACROMIAL DECOMPRESSION, ROTATOR CUFF REPAIR AND BICEP TENDON REPAIR Right 12/22/2021   Procedure: RIGHT SHOULDER ARTHROSCOPY WITH DEBRIDEMENT, DECOMPRESSION, ROTATOR CUFF REPAIR, AND BICEPS TENODESIS;  Surgeon: Corky Mull, MD;  Location: [facility];  Service: Orthopedics;  Laterality: Right;    Social History   Tobacco Use   Smoking status: Former    Packs/day: 1.00    Years: 35.00    Total pack years: 35.00    Types: Cigarettes    Quit date: 07/05/2004    Years since quitting: 17.7   Smokeless tobacco: Never  Vaping Use   Vaping Use: Never used  Substance Use Topics   Alcohol use: Yes    Alcohol/week: 3.0 standard drinks of alcohol    Types: 3 Glasses of wine per week   Drug use: Never     Medication list has been reviewed and updated.  Current Meds  Medication Sig   acetaminophen (TYLENOL) 325 MG tablet Take 650 mg by mouth every 6 (six) hours as needed.   aspirin EC 81 MG tablet Take 81 mg by mouth daily. Swallow whole.   Calcium Carbonate-Vit D-Min (CALCIUM 1200 PO) Take by mouth.   escitalopram (LEXAPRO) 10 MG tablet Take 1 tablet (10 mg total) by mouth daily.   Garlic 10 MG CAPS garlic   loratadine (CLARITIN) 10 MG tablet Take 10 mg by mouth daily.   meloxicam (MOBIC) 7.5 MG tablet Take 1 tablet by mouth 2 (two) times daily.   Multiple Vitamin (MULTIVITAMIN) capsule Take 1 capsule by mouth daily.   Omega-3 Fatty Acids (FISH OIL) 1000 MG CAPS Take 1 capsule by mouth daily.   [  DISCONTINUED] ibuprofen (ADVIL) 200 MG tablet Take 200 mg by mouth every 8 (eight) hours as needed for mild pain.       03/30/2022    9:40 AM 01/21/2022    1:38 PM  GAD 7 : Generalized Anxiety Score  Nervous, Anxious, on Edge 0 0  Control/stop worrying 0 0  Worry too much - different things 0 1  Trouble relaxing 0 0  Restless 0 0  Easily annoyed or irritable 0 0  Afraid - awful might happen 0 1  Total GAD 7 Score 0 2  Anxiety Difficulty Not difficult at all Not difficult at all        03/30/2022    9:40 AM 01/21/2022    1:38 PM  Depression screen PHQ 2/9  Decreased Interest 0 2  Down, Depressed, Hopeless 0 1  PHQ - 2 Score 0 3  Altered sleeping 0 1  Tired, decreased energy 0 1  Change in appetite 0 1  Feeling bad or failure about yourself  0 0  Trouble concentrating 0 1  Moving slowly or fidgety/restless 0 1  Suicidal thoughts 0 0  PHQ-9 Score 0 8  Difficult doing work/chores Not difficult at all Not difficult at all    BP Readings from Last 3 Encounters:  03/30/22 130/80  01/21/22 138/80  12/22/21 (!) 145/77    Physical Exam Vitals reviewed.  Constitutional:      Appearance: She is well-developed, well-groomed and overweight.  HENT:     Head: Normocephalic.     Jaw: There is normal jaw occlusion.     Right Ear: Hearing, tympanic membrane and external ear normal.     Left Ear: Hearing, tympanic membrane and external ear normal.     Nose: Nose normal.     Mouth/Throat:     Lips: Pink.     Mouth: Mucous membranes are moist.     Dentition: Normal dentition.     Tongue: No lesions.     Palate: No mass.     Pharynx: Oropharynx is clear. Uvula midline. No pharyngeal swelling.  Eyes:     General: Lids are normal. Vision grossly intact. Gaze aligned appropriately. No scleral icterus.       Left eye: No foreign body or hordeolum.     Extraocular Movements: Extraocular movements intact.     Conjunctiva/sclera: Conjunctivae normal.     Right eye: Right conjunctiva is not injected.     Left eye: Left conjunctiva is not injected.     Pupils: Pupils are equal, round, and reactive to light.  Neck:     Thyroid: No thyroid mass, thyromegaly or thyroid tenderness.     Vascular: No JVD.     Trachea: Trachea and phonation normal. No tracheal deviation.  Cardiovascular:     Rate and Rhythm: Normal rate and regular rhythm.     Pulses:          Carotid pulses are 2+ on the right side and 2+ on the left side.      Radial pulses are 2+ on the right side and 2+ on the  left side.       Femoral pulses are 2+ on the right side and 2+ on the left side.      Popliteal pulses are 2+ on the right side and 2+ on the left side.       Dorsalis pedis pulses are 2+ on the right side and 2+ on the left side.  Posterior tibial pulses are 2+ on the right side and 2+ on the left side.     Heart sounds: Normal heart sounds, S1 normal and S2 normal. No murmur heard.    No systolic murmur is present.     No diastolic murmur is present.     No friction rub. No gallop. No S3 or S4 sounds.  Pulmonary:     Effort: Pulmonary effort is normal. No respiratory distress.     Breath sounds: Normal breath sounds. No decreased breath sounds, wheezing, rhonchi or rales.  Chest:  Breasts:    Right: Normal. No swelling, bleeding, inverted nipple, mass, nipple discharge, skin change or tenderness.     Left: Normal. No swelling, bleeding, inverted nipple, mass, nipple discharge, skin change or tenderness.  Abdominal:     General: Bowel sounds are normal.     Palpations: Abdomen is soft. There is no hepatomegaly, splenomegaly, mass or pulsatile mass.     Tenderness: There is no abdominal tenderness. There is no guarding or rebound.  Genitourinary:    Rectum: Normal. Guaiac result negative. No mass or tenderness.  Musculoskeletal:        General: No tenderness. Normal range of motion.     Cervical back: Normal, full passive range of motion without pain, normal range of motion and neck supple.     Thoracic back: Normal.     Lumbar back: Normal.     Right lower leg: No edema.     Left lower leg: No edema.  Lymphadenopathy:     Head:     Right side of head: No submental, submandibular or tonsillar adenopathy.     Left side of head: No submental, submandibular or tonsillar adenopathy.     Cervical: No cervical adenopathy.     Right cervical: No superficial, deep or posterior cervical adenopathy.    Left cervical: No superficial, deep or posterior cervical adenopathy.     Upper  Body:     Right upper body: No supraclavicular or axillary adenopathy.     Left upper body: No supraclavicular or axillary adenopathy.  Skin:    General: Skin is warm.     Capillary Refill: Capillary refill takes less than 2 seconds.     Findings: No rash.  Neurological:     General: No focal deficit present.     Mental Status: She is alert and oriented to person, place, and time.     Cranial Nerves: Cranial nerves 2-12 are intact. No cranial nerve deficit.     Sensory: Sensation is intact.     Motor: Motor function is intact.     Coordination: Romberg sign negative.     Deep Tendon Reflexes: Reflexes are normal and symmetric. Reflexes normal.     Reflex Scores:      Tricep reflexes are 2+ on the right side and 2+ on the left side.      Bicep reflexes are 2+ on the left side.      Brachioradialis reflexes are 2+ on the right side and 2+ on the left side.      Patellar reflexes are 2+ on the right side and 2+ on the left side.      Achilles reflexes are 2+ on the right side and 2+ on the left side. Psychiatric:        Mood and Affect: Mood is not anxious or depressed.        Behavior: Behavior is cooperative.     Wt Readings from Last  3 Encounters:  03/30/22 175 lb (79.4 kg)  01/21/22 176 lb (79.8 kg)  12/22/21 175 lb (79.4 kg)    BP 130/80   Pulse 80   Ht 5' 4"  (1.626 m)   Wt 175 lb (79.4 kg)   BMI 30.04 kg/m   Assessment and Plan:  Makeya Hilgert is a 71 y.o. female who presents today for her Complete Annual Exam. She feels well. She reports exercising fitness center. She reports she is sleeping well.  Immunizations are reviewed and recommendations provided.   Age appropriate screening tests are discussed. Counseling given for risk factor reduction interventions.  1. Annual physical exam No subjective/objective concerns noted during HPI, review of past medical history and medications, review of systems, and physical exam.  We will check lipid panel and comprehensive  metabolic panel. - Lipid Panel With LDL/HDL Ratio - Comprehensive Metabolic Panel (CMET)  2. Encounter for screening mammogram for malignant neoplasm of breast Breast exam was normal and we will schedule for mammogram bilateral. - MM 3D SCREEN BREAST BILATERAL  3. Menopause Bone density was ordered given patient's age and menstrual history. - DG Bone Density  4. Colon cancer screening Discussed with patient and referral made to gastroenterology for colon cancer screening.  Rectal exam was normal with guaiac negative. - Ambulatory referral to Gastroenterology  5. Need for immunization against influenza Discussed and administered. - Flu Vaccine QUAD 26moIM (Fluarix, Fluzone & Alfiuria Quad PF)   DOtilio Miu MD

## 2022-03-30 NOTE — Telephone Encounter (Signed)
Called pt and left message concerning mammo appt- Thurs Oct 12 @ 120 in [place] with bone density to follow

## 2022-03-30 NOTE — Patient Instructions (Signed)
Mediterranean Diet ?A Mediterranean diet refers to food and lifestyle choices that are based on the traditions of countries located on the [place] Sea. It focuses on eating more fruits, vegetables, whole grains, beans, nuts, seeds, and heart-healthy fats, and eating less dairy, meat, eggs, and processed foods with added sugar, salt, and fat. This way of eating has been shown to help prevent certain conditions and improve outcomes for people who have chronic diseases, like kidney disease and heart disease. ?What are tips for following this plan? ?Reading food labels ?Check the serving size of packaged foods. For foods such as rice and pasta, the serving size refers to the amount of cooked product, not dry. ?Check the total fat in packaged foods. Avoid foods that have saturated fat or trans fats. ?Check the ingredient list for added sugars, such as corn syrup. ?Shopping ? ?Buy a variety of foods that offer a balanced diet, including: ?Fresh fruits and vegetables (produce). ?Grains, beans, nuts, and seeds. Some of these may be available in unpackaged forms or large amounts (in bulk). ?Fresh seafood. ?Poultry and eggs. ?Low-fat dairy products. ?Buy whole ingredients instead of prepackaged foods. ?Buy fresh fruits and vegetables in-season from local farmers markets. ?Buy plain frozen fruits and vegetables. ?If you do not have access to quality fresh seafood, buy precooked frozen shrimp or canned fish, such as tuna, salmon, or sardines. ?Stock your pantry so you always have certain foods on hand, such as olive oil, canned tuna, canned tomatoes, rice, pasta, and beans. ?Cooking ?Cook foods with extra-virgin olive oil instead of using butter or other vegetable oils. ?Have meat as a side dish, and have vegetables or grains as your main dish. This means having meat in small portions or adding small amounts of meat to foods like pasta or stew. ?Use beans or vegetables instead of meat in common dishes like chili or  lasagna. ?Experiment with different cooking methods. Try roasting, broiling, steaming, and saut?ing vegetables. ?Add frozen vegetables to soups, stews, pasta, or rice. ?Add nuts or seeds for added healthy fats and plant protein at each meal. You can add these to yogurt, salads, or vegetable dishes. ?Marinate fish or vegetables using olive oil, lemon juice, garlic, and fresh herbs. ?Meal planning ?Plan to eat one vegetarian meal one day each week. Try to work up to two vegetarian meals, if possible. ?Eat seafood two or more times a week. ?Have healthy snacks readily available, such as: ?Vegetable sticks with hummus. ?Greek yogurt. ?Fruit and nut trail mix. ?Eat balanced meals throughout the week. This includes: ?Fruit: 2-3 servings a day. ?Vegetables: 4-5 servings a day. ?Low-fat dairy: 2 servings a day. ?Fish, poultry, or lean meat: 1 serving a day. ?Beans and legumes: 2 or more servings a week. ?Nuts and seeds: 1-2 servings a day. ?Whole grains: 6-8 servings a day. ?Extra-virgin olive oil: 3-4 servings a day. ?Limit red meat and sweets to only a few servings a month. ?Lifestyle ? ?Cook and eat meals together with your family, when possible. ?Drink enough fluid to keep your urine pale yellow. ?Be physically active every day. This includes: ?Aerobic exercise like running or swimming. ?Leisure activities like gardening, walking, or housework. ?Get 7-8 hours of sleep each night. ?If recommended by your health care provider, drink red wine in moderation. This means 1 glass a day for nonpregnant women and 2 glasses a day for men. A glass of wine equals 5 oz (150 mL). ?What foods should I eat? ?Fruits ?Apples. Apricots. Avocado. Berries. Bananas. Cherries. Dates.   Figs. Grapes. Lemons. Melon. Oranges. Peaches. Plums. Pomegranate. ?Vegetables ?Artichokes. Beets. Broccoli. Cabbage. Carrots. Eggplant. Green beans. Chard. Kale. Spinach. Onions. Leeks. Peas. Squash. Tomatoes. Peppers. Radishes. ?Grains ?Whole-grain pasta. Brown  rice. Bulgur wheat. Polenta. Couscous. Whole-wheat bread. Oatmeal. Quinoa. ?Meats and other proteins ?Beans. Almonds. Sunflower seeds. Pine nuts. Peanuts. Cod. Salmon. Scallops. Shrimp. Tuna. Tilapia. Clams. Oysters. Eggs. Poultry without skin. ?Dairy ?Low-fat milk. Cheese. Greek yogurt. ?Fats and oils ?Extra-virgin olive oil. Avocado oil. Grapeseed oil. ?Beverages ?Water. Red wine. Herbal tea. ?Sweets and desserts ?Greek yogurt with honey. Baked apples. Poached pears. Trail mix. ?Seasonings and condiments ?Basil. Cilantro. Coriander. Cumin. Mint. Parsley. Sage. Rosemary. Tarragon. Garlic. Oregano. Thyme. Pepper. Balsamic vinegar. Tahini. Hummus. Tomato sauce. Olives. Mushrooms. ?The items listed above may not be a complete list of foods and beverages you can eat. Contact a dietitian for more information. ?What foods should I limit? ?This is a list of foods that should be eaten rarely or only on special occasions. ?Fruits ?Fruit canned in syrup. ?Vegetables ?Deep-fried potatoes (french fries). ?Grains ?Prepackaged pasta or rice dishes. Prepackaged cereal with added sugar. Prepackaged snacks with added sugar. ?Meats and other proteins ?Beef. Pork. Lamb. Poultry with skin. Hot dogs. Bacon. ?Dairy ?Ice cream. Sour cream. Whole milk. ?Fats and oils ?Butter. Canola oil. Vegetable oil. Beef fat (tallow). Lard. ?Beverages ?Juice. Sugar-sweetened soft drinks. Beer. Liquor and spirits. ?Sweets and desserts ?Cookies. Cakes. Pies. Candy. ?Seasonings and condiments ?Mayonnaise. Pre-made sauces and marinades. ?The items listed above may not be a complete list of foods and beverages you should limit. Contact a dietitian for more information. ?Summary ?The Mediterranean diet includes both food and lifestyle choices. ?Eat a variety of fresh fruits and vegetables, beans, nuts, seeds, and whole grains. ?Limit the amount of red meat and sweets that you eat. ?If recommended by your health care provider, drink red wine in moderation.  This means 1 glass a day for nonpregnant women and 2 glasses a day for men. A glass of wine equals 5 oz (150 mL). ?This information is not intended to replace advice given to you by your health care provider. Make sure you discuss any questions you have with your health care provider. ?Document Revised: 07/27/2019 Document Reviewed: 05/24/2019 ?Elsevier Patient Education ? 2023 Elsevier Inc. ? ?

## 2022-03-31 DIAGNOSIS — M1611 Unilateral primary osteoarthritis, right hip: Secondary | ICD-10-CM | POA: Insufficient documentation

## 2022-03-31 LAB — LIPID PANEL WITH LDL/HDL RATIO
Cholesterol, Total: 289 mg/dL — ABNORMAL HIGH (ref 100–199)
HDL: 59 mg/dL (ref >39)
LDL Chol Calc (NIH): 205 mg/dL — ABNORMAL HIGH (ref 0–99)
LDL/HDL Ratio: 3.5 ratio — ABNORMAL HIGH (ref 0.0–3.2)
Triglycerides: 136 mg/dL (ref 0–149)
VLDL Cholesterol Cal: 25 mg/dL (ref 5–40)

## 2022-03-31 LAB — COMPREHENSIVE METABOLIC PANEL
ALT: 21 IU/L (ref 0–32)
AST: 19 IU/L (ref 0–40)
Albumin/Globulin Ratio: 1.7 (ref 1.2–2.2)
Albumin: 4.5 g/dL (ref 3.9–4.9)
Alkaline Phosphatase: 69 IU/L (ref 44–121)
BUN/Creatinine Ratio: 20 (ref 12–28)
BUN: 17 mg/dL (ref 8–27)
Bilirubin Total: 0.4 mg/dL (ref 0.0–1.2)
CO2: 25 mmol/L (ref 20–29)
Calcium: 9.4 mg/dL (ref 8.7–10.3)
Chloride: 98 mmol/L (ref 96–106)
Creatinine, Ser: 0.86 mg/dL (ref 0.57–1.00)
Globulin, Total: 2.6 g/dL (ref 1.5–4.5)
Glucose: 93 mg/dL (ref 70–99)
Potassium: 4.6 mmol/L (ref 3.5–5.2)
Sodium: 137 mmol/L (ref 134–144)
Total Protein: 7.1 g/dL (ref 6.0–8.5)
eGFR: 73 mL/min/{1.73_m2} (ref >59)

## 2022-04-15 ENCOUNTER — Ambulatory Visit
Admission: RE | Admit: 2022-04-15 | Discharge: 2022-04-15 | Disposition: A | Discharge status: Home / Self Care | Payer: Medicare Other | Source: Ambulatory Visit | Attending: Family Medicine | Admitting: Family Medicine

## 2022-04-15 DIAGNOSIS — Z1231 Encounter for screening mammogram for malignant neoplasm of breast: Secondary | ICD-10-CM | POA: Diagnosis present

## 2022-04-15 DIAGNOSIS — Z780 Asymptomatic menopausal state: Secondary | ICD-10-CM | POA: Diagnosis present

## 2022-04-28 ENCOUNTER — Telehealth: Payer: Self-pay | Admitting: Family Medicine

## 2022-04-28 ENCOUNTER — Other Ambulatory Visit: Payer: Self-pay

## 2022-04-28 NOTE — Telephone Encounter (Signed)
Medication Refill - Medication:  albuterol sulfate inhalation aerosol  90 mcg 6.7 gram   Has the patient contacted their pharmacy? Yes.    (Agent: If yes, when and what did the pharmacy advise?) no current Rx, call doctor  Preferred Pharmacy (with phone number or street name):  [id number] - [place], [address] AT [place] Phone:  [phone]  Fax:  [phone]     Has the patient been seen for an appointment in the last year OR does the patient have an upcoming appointment? Yes.    Agent: Please be advised that RX refills may take up to 3 business days. We ask that you follow-up with your pharmacy.

## 2022-04-30 ENCOUNTER — Inpatient Hospital Stay
Admission: RE | Admit: 2022-04-30 | Discharge: 2022-04-30 | Disposition: A | Discharge status: Home / Self Care | Payer: Self-pay | Source: Ambulatory Visit | Attending: *Deleted | Admitting: *Deleted

## 2022-04-30 ENCOUNTER — Other Ambulatory Visit: Payer: Self-pay | Admitting: Family Medicine

## 2022-04-30 ENCOUNTER — Other Ambulatory Visit: Payer: Self-pay | Admitting: *Deleted

## 2022-04-30 DIAGNOSIS — Z1231 Encounter for screening mammogram for malignant neoplasm of breast: Secondary | ICD-10-CM

## 2022-05-03 ENCOUNTER — Other Ambulatory Visit: Payer: Self-pay | Admitting: Family Medicine

## 2022-05-03 DIAGNOSIS — R928 Other abnormal and inconclusive findings on diagnostic imaging of breast: Secondary | ICD-10-CM

## 2022-05-07 ENCOUNTER — Other Ambulatory Visit: Payer: Self-pay

## 2022-05-07 DIAGNOSIS — J4520 Mild intermittent asthma, uncomplicated: Secondary | ICD-10-CM

## 2022-05-07 MED ORDER — ALBUTEROL SULFATE HFA 108 (90 BASE) MCG/ACT IN AERS: 2.0000 | INHALATION_SPRAY | RESPIRATORY_TRACT | 1 refills | Status: DC | PRN

## 2022-05-07 NOTE — Telephone Encounter (Signed)
Pt states she was just seen 9/26 and informed the nurse she is still using this inhaler.  albuterol (VENTOLIN HFA) 108 (90 Base) MCG/ACT inhaler   Pt would like this called into  [place], [place] [address] AT [place]

## 2022-05-12 ENCOUNTER — Ambulatory Visit
Admission: RE | Admit: 2022-05-12 | Discharge: 2022-05-12 | Disposition: A | Discharge status: Home / Self Care | Payer: Medicare Other | Source: Ambulatory Visit | Attending: Family Medicine | Admitting: Family Medicine

## 2022-05-12 DIAGNOSIS — R928 Other abnormal and inconclusive findings on diagnostic imaging of breast: Secondary | ICD-10-CM

## 2022-05-13 ENCOUNTER — Other Ambulatory Visit: Payer: Self-pay | Admitting: Family Medicine

## 2022-05-13 DIAGNOSIS — R928 Other abnormal and inconclusive findings on diagnostic imaging of breast: Secondary | ICD-10-CM

## 2022-05-25 ENCOUNTER — Other Ambulatory Visit: Payer: Self-pay | Admitting: Family Medicine

## 2022-05-25 DIAGNOSIS — R928 Other abnormal and inconclusive findings on diagnostic imaging of breast: Secondary | ICD-10-CM

## 2022-05-31 ENCOUNTER — Ambulatory Visit
Admission: RE | Admit: 2022-05-31 | Discharge: 2022-05-31 | Disposition: A | Discharge status: Home / Self Care | Payer: Medicare Other | Source: Ambulatory Visit | Attending: Family Medicine | Admitting: Family Medicine

## 2022-05-31 DIAGNOSIS — R928 Other abnormal and inconclusive findings on diagnostic imaging of breast: Secondary | ICD-10-CM | POA: Diagnosis not present

## 2022-05-31 HISTORY — PX: BREAST BIOPSY: SHX20

## 2022-05-31 MED ORDER — LIDOCAINE-EPINEPHRINE 1 %-1:100000 IJ SOLN
10.0000 mL | Freq: Once | INTRAMUSCULAR | Status: AC
  Administered 2022-05-31: 10 mL

## 2022-05-31 MED ORDER — LIDOCAINE HCL (PF) 1 % IJ SOLN
20.0000 mL | Freq: Once | INTRAMUSCULAR | Status: AC
  Administered 2022-05-31: 20 mL

## 2022-06-01 ENCOUNTER — Encounter: Payer: Self-pay | Admitting: *Deleted

## 2022-06-01 DIAGNOSIS — N6489 Other specified disorders of breast: Secondary | ICD-10-CM

## 2022-06-01 LAB — SURGICAL PATHOLOGY

## 2022-06-01 NOTE — Progress Notes (Signed)
Referral recieved from [facility] for benign breast mass.  Referral sent to [facility] per patient preference.

## 2022-06-03 ENCOUNTER — Encounter: Payer: Self-pay | Admitting: Surgery

## 2022-06-03 ENCOUNTER — Ambulatory Visit: Payer: Medicare Other | Admitting: Surgery

## 2022-06-03 VITALS — BP 136/82 | HR 74 | Temp 98.0°F | Ht 65.0 in | Wt 176.0 lb

## 2022-06-03 DIAGNOSIS — N6489 Other specified disorders of breast: Secondary | ICD-10-CM | POA: Diagnosis not present

## 2022-06-03 NOTE — Progress Notes (Signed)
Patient ID: Tara Olson, female   DOB: [birth year], 71 y.o.   MRN: [id number]  Chief Complaint: Complex sclerosing lesion right breast  History of Present Illness Tara Olson is a 71 y.o. female with prior history of right breast biopsy early of 2022.  This was completed in [place]: pathology was benign, and consistent with fibrocystic change.  She is currently postmenopausal.  She thought she may have a maternal aunt who had breast cancer, but is uncertain.  She began menstruating at the age of 49.  She is gravida 4 para 2 she had 2 miscarriages.  She is 71 years old with her first pregnancy which she carried to term.  She breast-fed.  She denies any other breast history in terms of breast pain, nipple discharge, skin changes or palpable lump.  She does not perform monthly breast exams.  This current lesion was picked up on annual mammography.  She has subsequently had a ultrasound which confirmed and correlated with mammography and so biopsy was completed under ultrasound.  She has a marker that is slightly off of the target.  Past Medical History Past Medical History:  Diagnosis Date   Depression    Dyspnea    GERD (gastroesophageal reflux disease)    Sleep apnea       Past Surgical History:  Procedure Laterality Date   BREAST BIOPSY Right 08/04/2020   Ultrasound biopsy   BREAST BIOPSY Right 05/31/2022   Right Breast distortion Ribbon Clip - path pending   BREAST BIOPSY Right 05/31/2022   MM RT BREAST BX W LOC DEV 1ST LESION IMAGE BX SPEC STEREO GUIDE 05/31/2022 [facility]   EXPLORATORY LAPAROTOMY  1989   SHOULDER ARTHROSCOPY WITH SUBACROMIAL DECOMPRESSION, ROTATOR CUFF REPAIR AND BICEP TENDON REPAIR Right 12/22/2021   Procedure: RIGHT SHOULDER ARTHROSCOPY WITH DEBRIDEMENT, DECOMPRESSION, ROTATOR CUFF REPAIR, AND BICEPS TENODESIS;  Surgeon: Tara Mull, MD;  Location: [facility];  Service: Orthopedics;  Laterality: Right;    Allergies  Allergen Reactions    Hydrocodone-Acetaminophen     Allergic to VICODIN   Penicillins     Current Outpatient Medications  Medication Sig Dispense Refill   acetaminophen (TYLENOL) 325 MG tablet Take 650 mg by mouth every 6 (six) hours as needed.     albuterol (VENTOLIN HFA) 108 (90 Base) MCG/ACT inhaler Inhale 2 puffs into the lungs every 4 (four) hours as needed. 1 each 1   aspirin EC 81 MG tablet Take 81 mg by mouth daily. Swallow whole.     Calcium Carbonate-Vit D-Min (CALCIUM 1200 PO) Take by mouth.     escitalopram (LEXAPRO) 10 MG tablet TAKE 1 TABLET(10 MG) BY MOUTH DAILY 90 tablet 0   Garlic 10 MG CAPS garlic     loratadine (CLARITIN) 10 MG tablet Take 10 mg by mouth daily.     meloxicam (MOBIC) 7.5 MG tablet Take 1 tablet by mouth 2 (two) times daily.     Multiple Vitamin (MULTIVITAMIN) capsule Take 1 capsule by mouth daily.     Omega-3 Fatty Acids (FISH OIL) 1000 MG CAPS Take 1 capsule by mouth daily.     No current facility-administered medications for this visit.    Family History Family History  Problem Relation Age of Onset   Colon cancer Mother    Diabetes Father    Hypertension Father    Heart disease Father    Heart disease Brother    Breast cancer Maternal Aunt       Social History Social History   Tobacco  Use   Smoking status: Former    Packs/day: 1.00    Years: 35.00    Total pack years: 35.00    Types: Cigarettes    Quit date: 07/05/2004    Years since quitting: 17.9    Passive exposure: Past   Smokeless tobacco: Never  Vaping Use   Vaping Use: Never used  Substance Use Topics   Alcohol use: Yes    Alcohol/week: 3.0 standard drinks of alcohol    Types: 3 Glasses of wine per week   Drug use: Never        Review of Systems  Constitutional: Negative.   HENT: Negative.    Eyes: Negative.   Respiratory:  Positive for shortness of breath.   Cardiovascular: Negative.   Gastrointestinal: Negative.   Genitourinary:  Positive for frequency.  Skin:  Positive for  itching.  Neurological: Negative.   Psychiatric/Behavioral:  Positive for depression.      Physical Exam Blood pressure 136/82, pulse 74, temperature 98 F (36.7 C), height 5\' 5"  (1.651 m), weight 176 lb (79.8 kg), SpO2 98 %. Last Weight  Most recent update: 06/03/2022  1:47 PM    Weight  79.8 kg (176 lb)             CONSTITUTIONAL: Well developed, and nourished, appropriately responsive and aware without distress.   EYES: Sclera non-icteric.   EARS, NOSE, MOUTH AND THROAT:  The oropharynx is clear. Oral mucosa is pink and moist.     Hearing is intact to voice.  NECK: Trachea is midline, and there is no jugular venous distension.  LYMPH NODES:  Lymph nodes in the neck are not appreciated. RESPIRATORY:  Normal respiratory effort without pathologic use of accessory muscles. CARDIOVASCULAR: Well perfused.  GI: The abdomen is soft, nontender, and nondistended. MUSCULOSKELETAL:  Symmetrical muscle tone appreciated in all four extremities.    SKIN: Skin turgor is normal. No pathologic skin lesions appreciated.  NEUROLOGIC:  Motor and sensation appear grossly normal.  Cranial nerves are grossly without defect. PSYCH:  Alert and oriented to person, place and time. Affect is appropriate for situation.  Data Reviewed I have personally reviewed what is currently available of the patient's imaging, recent labs and medical records.   Labs:     Latest Ref Rng & Units 12/16/2021   11:23 AM  CBC  WBC 4.0 - 10.5 K/uL 7.0   Hemoglobin 12.0 - 15.0 g/dL 13.8   Hematocrit 36.0 - 46.0 % 42.8   Platelets 150 - 400 K/uL 261       Latest Ref Rng & Units 03/30/2022   10:37 AM  CMP  Glucose 70 - 99 mg/dL 93   BUN 8 - 27 mg/dL 17   Creatinine 0.57 - 1.00 mg/dL 0.86   Sodium 134 - 144 mmol/L 137   Potassium 3.5 - 5.2 mmol/L 4.6   Chloride 96 - 106 mmol/L 98   CO2 20 - 29 mmol/L 25   Calcium 8.7 - 10.3 mg/dL 9.4   Total Protein 6.0 - 8.5 g/dL 7.1   Total Bilirubin 0.0 - 1.2 mg/dL 0.4    Alkaline Phos 44 - 121 IU/L 69   AST 0 - 40 IU/L 19   ALT 0 - 32 IU/L 21    SURGICAL PATHOLOGY  CASE: [id number]  PATIENT: Tara Olson  Surgical Pathology Report   Specimen Submitted:  A. Breast, right outer   Clinical History: Architectural distortion of the outer right breast.  CA vs CSL vs stromal fibrosis.  Outside biopsy of inner right breast  (path results unknown to me).   Abnormal finding on breast imaging R92.8  - Primary. Ribbon-shaped clip placed following stereotactic biopsy of  RIGHT breast, outer.   DIAGNOSIS:  A. BREAST, RIGHT OUTER; STEREOTACTIC CORE NEEDLE BIOPSY:  - COMPLEX SCLEROSING LESION WITH ASSOCIATED FLORID USUAL DUCTAL  HYPERPLASIA.  - ADJACENT FIBROCYSTIC AND APOCRINE CHANGES.  - CALCIFICATIONS ASSOCIATED WITH BENIGN MAMMARY ELEMENTS.  - NEGATIVE FOR ATYPICAL PROLIFERATIVE BREAST DISEASE.    Imaging: Radiological images reviewed:  CLINICAL DATA:  71 year old female presenting for screening recall for possible right breast distortion   EXAM: DIGITAL DIAGNOSTIC UNILATERAL RIGHT MAMMOGRAM WITH TOMOSYNTHESIS; ULTRASOUND RIGHT BREAST LIMITED   TECHNIQUE: Right digital diagnostic mammography and breast tomosynthesis was performed.; Targeted ultrasound examination of the right breast was performed   COMPARISON:  Previous exam(s).   ACR Breast Density Category b: There are scattered areas of fibroglandular density.   FINDINGS: Diagnostic views of the right breast demonstrate a persistent area of architectural distortion in the upper-outer quadrant anterior depth (spot CC image 34, spot MLO 2/2 image 37, ML image 45).   Targeted right breast ultrasound was performed. At 9 o'clock 3 cm from the nipple, there is a subtle area of shadowing with associated distortion of the surrounding tissue. No discrete mass is visualized. This corresponds with architectural distortion seen on mammogram.   Targeted right axillary ultrasound  demonstrates a morphologically benign-appearing axillary lymph node.   IMPRESSION: Right breast architectural distortion in the upper-outer quadrant, with a sonographic correlate, is intermediate suspicion for malignancy. Given that the area is more conspicuous mammographically, recommend further assessment with stereotactic guided biopsy.   RECOMMENDATION: Right breast stereotactic guided biopsy (1 site).   I have discussed the findings and recommendations with the patient. The biopsy procedure was discussed with the patient and questions were answered. Patient expressed their understanding of the biopsy recommendation. Patient will be scheduled for biopsy at her earliest convenience by the schedulers. Ordering provider will be notified. If applicable, a reminder letter will be sent to the patient regarding the next appointment.   BI-RADS CATEGORY  4: Suspicious.     Electronically Signed   By: Jacob Moores M.D.   On: 05/12/2022 15:50 Within last 24 hrs: No results found.  CLINICAL DATA:  Status post stereotactic biopsy for architectural distortion within the outer RIGHT breast.   EXAM: 3D DIAGNOSTIC RIGHT MAMMOGRAM POST STEREOTACTIC BIOPSY   COMPARISON:  Previous exam(s).   FINDINGS: 3D Mammographic images were obtained following stereotactic guided biopsy of architectural distortion within the outer RIGHT breast. The biopsy marking clip is in expected position at the site of biopsy. Ribbon shaped clip migrated approximately 6 mm anterior to the biopsied architectural distortion within the outer RIGHT breast.   IMPRESSION: Ribbon shaped clip migrated slightly anterior (approximately 6 mm) to the biopsied architectural distortion within the outer RIGHT breast.   Final Assessment: Post Procedure Mammograms for Marker Placement     Electronically Signed   By: Bary Richard M.D.   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   Plan    RFID tag localization right breast excisional biopsy.  Risks of above procedure discussed in detail these include but are not limited to anesthesia, bleeding, change in cosmetic appearance, need for further excision, or close follow-up.  She is aware that she will continue to have breast screening completed in the  months and years to come.  She was given an option to observe this area with additional imaging.  She desires to proceed with surgery.  Face-to-face time spent with the patient and accompanying care providers(if present) was 30 minutes, with more than 50% of the time spent counseling, educating, and coordinating care of the patient.    These notes generated with voice recognition software. I apologize for typographical errors.  Ronny Bacon M.D., FACS 06/04/2022, 10:51 AM

## 2022-06-03 NOTE — Patient Instructions (Addendum)
Call and let us know what your decision is. If you do not decide to do surgery we will have you follow up with [place] in 6 months with a right breast diagnostic mammogram prior.    We have spoken today about removing a lump in your breast. This will be done by Dr. Claudine Mouton at [facility].  You will most likely be able to leave the hospital several hours after your surgery. Rarely, a patient needs to stay over night but this is a possibility.  Plan to tenatively be off work for 1 week following the surgery and may return with approximately 2 more weeks of a lifting restriction, no greater than 15 lbs.  Please see your Blue surgery sheet for more information. Our surgery scheduler will call you to look at surgery dates and to go over information.   What is radio frequency localization of the breast?(RFID) RFID tag localization uses radiofrequency technology to accurately pinpoint the tumor. Seeing exactly where the tumor is before surgery helps surgeons more effectively remove the entire tumor and spare surrounding healthy breast tissue.     Lumpectomy A lumpectomy is a form of "breast conserving" or "breast preservation" surgery. It may also be referred to as a partial mastectomy. During a lumpectomy, the portion of the breast that contains the cancerous tumor or breast mass (the lump) is removed. Some normal tissue around the lump may also be removed to make sure all of the tumor has been removed.  LET [facility] CARE PROVIDER KNOW ABOUT: Any allergies you have. All medicines you are taking, including vitamins, herbs, eye drops, creams, and over-the-counter medicines. Previous problems you or members of your family have had with the use of anesthetics. Any blood disorders you have. Previous surgeries you have had. Medical conditions you have. RISKS AND COMPLICATIONS Generally, this is a safe procedure. However, problems can occur and include: Bleeding. Infection. Pain. Temporary  swelling. Change in the shape of the breast, particularly if a large portion is removed. BEFORE THE PROCEDURE Ask your health care provider about changing or stopping your regular medicines. This is especially important if you are taking diabetes medicines or blood thinners. Do not eat or drink anything after midnight on the night before the procedure or as directed by your health care provider. Ask your health care provider if you can take a sip of water with any approved medicines. On the day of surgery, your health care provider will use a mammogram or ultrasound to locate and mark the tumor in your breast. These markings on your breast will show where the cut (incision) will be made. PROCEDURE  An IV tube will be put into one of your veins. You may be given medicine to help you relax before the surgery (sedative). You will be given one of the following: A medicine that numbs the area (local anesthetic). A medicine that makes you fall asleep (general anesthetic). Your health care provider will use a kind of electric scalpel that uses heat to minimize bleeding (electrocautery knife). A curved incision (like a smile or frown) that follows the natural curve of your breast is made, to allow for minimal scarring and better healing. The tumor will be removed with some of the surrounding tissue. This will be sent to the lab for analysis. Your health care provider may also remove your lymph nodes at this time if needed. Sometimes, but not always, a rubber tube called a drain will be surgically inserted into your breast area or armpit to  collect excess fluid that may accumulate in the space where the tumor was. This drain is connected to a plastic bulb on the outside of your body. This drain creates suction to help remove the fluid. The incisions will be closed with stitches (sutures). A bandage may be placed over the incisions. AFTER THE PROCEDURE You will be taken to the recovery area. You will be  given medicine for pain. A small rubber drain may be placed in the breast for 2-3 days to prevent a collection of blood (hematoma) from developing in the breast. You will be given instructions on caring for the drain before you go home. A pressure bandage (dressing) will be applied for 1-2 days to prevent bleeding. Ask your health care provider how to care for your bandage at home.   This information is not intended to replace advice given to you by your health care provider. Make sure you discuss any questions you have with your health care provider.   Document Released: 08/02/2006 Document Revised: 07/12/2014 Document Reviewed: 11/24/2012 Elsevier Interactive Patient Education [employer]

## 2022-06-04 ENCOUNTER — Ambulatory Visit: Payer: Self-pay | Admitting: Surgery

## 2022-06-04 DIAGNOSIS — N6489 Other specified disorders of breast: Secondary | ICD-10-CM | POA: Insufficient documentation

## 2022-06-04 DIAGNOSIS — M25551 Pain in right hip: Secondary | ICD-10-CM | POA: Diagnosis not present

## 2022-06-04 DIAGNOSIS — M1611 Unilateral primary osteoarthritis, right hip: Secondary | ICD-10-CM | POA: Diagnosis not present

## 2022-06-04 DIAGNOSIS — G8929 Other chronic pain: Secondary | ICD-10-CM | POA: Diagnosis not present

## 2022-06-04 DIAGNOSIS — M6281 Muscle weakness (generalized): Secondary | ICD-10-CM | POA: Diagnosis not present

## 2022-07-26 ENCOUNTER — Ambulatory Visit (INDEPENDENT_AMBULATORY_CARE_PROVIDER_SITE_OTHER): Payer: Medicare Other

## 2022-07-26 NOTE — Progress Notes (Signed)
I attempted to contact the patient, no answer. The message stated that the message was forwarded to voicemail.

## 2022-07-26 NOTE — Patient Instructions (Signed)
Health Maintenance, Female Adopting a healthy lifestyle and getting preventive care are important in promoting health and wellness. Ask your health care provider about: The right schedule for you to have regular tests and exams. Things you can do on your own to prevent diseases and keep yourself healthy. What should I know about diet, weight, and exercise? Eat a healthy diet  Eat a diet that includes plenty of vegetables, fruits, low-fat dairy products, and lean protein. Do not eat a lot of foods that are high in solid fats, added sugars, or sodium. Maintain a healthy weight Body mass index (BMI) is used to identify weight problems. It estimates body fat based on height and weight. Your health care provider can help determine your BMI and help you achieve or maintain a healthy weight. Get regular exercise Get regular exercise. This is one of the most important things you can do for your health. Most adults should: Exercise for at least 150 minutes each week. The exercise should increase your heart rate and make you sweat (moderate-intensity exercise). Do strengthening exercises at least twice a week. This is in addition to the moderate-intensity exercise. Spend less time sitting. Even light physical activity can be beneficial. Watch cholesterol and blood lipids Have your blood tested for lipids and cholesterol at 70 years of age, then have this test every 5 years. Have your cholesterol levels checked more often if: Your lipid or cholesterol levels are high. You are older than 70 years of age. You are at high risk for heart disease. What should I know about cancer screening? Depending on your health history and family history, you may need to have cancer screening at various ages. This may include screening for: Breast cancer. Cervical cancer. Colorectal cancer. Skin cancer. Lung cancer. What should I know about heart disease, diabetes, and high blood pressure? Blood pressure and heart  disease High blood pressure causes heart disease and increases the risk of stroke. This is more likely to develop in people who have high blood pressure readings or are overweight. Have your blood pressure checked: Every 3-5 years if you are 18-39 years of age. Every year if you are 40 years old or older. Diabetes Have regular diabetes screenings. This checks your fasting blood sugar level. Have the screening done: Once every three years after age 40 if you are at a normal weight and have a low risk for diabetes. More often and at a younger age if you are overweight or have a high risk for diabetes. What should I know about preventing infection? Hepatitis B If you have a higher risk for hepatitis B, you should be screened for this virus. Talk with your health care provider to find out if you are at risk for hepatitis B infection. Hepatitis C Testing is recommended for: Everyone born from 1945 through 1965. Anyone with known risk factors for hepatitis C. Sexually transmitted infections (STIs) Get screened for STIs, including gonorrhea and chlamydia, if: You are sexually active and are younger than 70 years of age. You are older than 70 years of age and your health care provider tells you that you are at risk for this type of infection. Your sexual activity has changed since you were last screened, and you are at increased risk for chlamydia or gonorrhea. Ask your health care provider if you are at risk. Ask your health care provider about whether you are at high risk for HIV. Your health care provider may recommend a prescription medicine to help prevent HIV   infection. If you choose to take medicine to prevent HIV, you should first get tested for HIV. You should then be tested every 3 months for as long as you are taking the medicine. Pregnancy If you are about to stop having your period (premenopausal) and you may become pregnant, seek counseling before you get pregnant. Take 400 to 800  micrograms (mcg) of folic acid every day if you become pregnant. Ask for birth control (contraception) if you want to prevent pregnancy. Osteoporosis and menopause Osteoporosis is a disease in which the bones lose minerals and strength with aging. This can result in bone fractures. If you are 65 years old or older, or if you are at risk for osteoporosis and fractures, ask your health care provider if you should: Be screened for bone loss. Take a calcium or vitamin D supplement to lower your risk of fractures. Be given hormone replacement therapy (HRT) to treat symptoms of menopause. Follow these instructions at home: Alcohol use Do not drink alcohol if: Your health care provider tells you not to drink. You are pregnant, may be pregnant, or are planning to become pregnant. If you drink alcohol: Limit how much you have to: 0-1 drink a day. Know how much alcohol is in your drink. In the [place] one drink equals one 12 oz bottle of beer (355 mL), one 5 oz glass of wine (148 mL), or one 1 oz glass of hard liquor (44 mL). Lifestyle Do not use any products that contain nicotine or tobacco. These products include cigarettes, chewing tobacco, and vaping devices, such as e-cigarettes. If you need help quitting, ask your health care provider. Do not use street drugs. Do not share needles. Ask your health care provider for help if you need support or information about quitting drugs. General instructions Schedule regular health, dental, and eye exams. Stay current with your vaccines. Tell your health care provider if: You often feel depressed. You have ever been abused or do not feel safe at home. Summary Adopting a healthy lifestyle and getting preventive care are important in promoting health and wellness. Follow your health care provider's instructions about healthy diet, exercising, and getting tested or screened for diseases. Follow your health care provider's instructions on monitoring your  cholesterol and blood pressure. This information is not intended to replace advice given to you by your health care provider. Make sure you discuss any questions you have with your health care provider. Document Revised: 11/10/2020 Document Reviewed: 11/10/2020 Elsevier Patient Education  2023 Else[employer] Inc.  Mammogram A mammogram is an X-ray of the breasts. This procedure can screen for and detect any changes that may indicate breast cancer. Mammograms are regularly done beginning at age 40 for women with average risk. A man may have a mammogram if he has a lump or swelling in his breast tissue. A mammogram can also identify other changes and variations in the breast, such as: Inflammation of the breast tissue (mastitis). An infected area that contains a collection of pus (abscess). A fluid-filled sac (cyst). Tumors that are not cancerous (benign). Fibrocystic changes. This is when breast tissue becomes denser and can make the tissue feel rope-like or uneven under the skin. Women at higher risk for breast cancer need earlier and more comprehensive screening for abnormal changes. Breast tomosynthesis, or three-dimensional (3D) mammography, and digital breast tomosynthesis are advanced forms of imaging that create 3D pictures of the breasts. Tell a health care provider: About any allergies you have. If you have breast implants. If   you have had previous breast disease, biopsy, or surgery. If you have a family history of breast cancer. If you are breastfeeding. Whether you are pregnant or may be pregnant. What are the risks? Generally, this is a safe procedure. However, problems may occur, including: Exposure to radiation. Radiation levels are very low with this test. The need for more tests. The mammogram fails to detect certain cancers or the results are misinterpreted. Difficulty with detecting breast cancer in women with dense breasts. What happens before the procedure? Schedule your  test about 1-2 weeks after your menstrual period if you are menstruating. This is usually when your breasts are the least tender. If you have had a mammogram done at a different facility in the past, get the mammogram X-rays or have them sent to your current exam facility. The new and old images will be compared. Wash your breasts and underarms on the day of the test. Do not wear deodorants, perfumes, lotions, or powders anywhere on your body on the day of the test. Remove any jewelry from your neck. Wear clothes that you can change into and out of easily. What happens during the procedure?  You will undress from the waist up and put on a gown that opens in the front. You will stand in front of the X-ray machine. Each breast will be placed between two plastic or glass plates. The plates will compress your breast for a few seconds. Try to stay as relaxed as possible. This procedure does not cause any harm to your breasts. Any discomfort you feel will be very brief. X-rays will be taken from different angles of each breast. The procedure may vary among health care providers and hospitals. What can I expect after the procedure? The mammogram will be examined by a specialist (radiologist). You may need to repeat certain parts of the test, depending on the quality of the images. This is done if the radiologist needs a better view of the breast tissue. You may resume your normal activities. It is up to you to get the results of your procedure. Ask your health care provider, or the department that is doing the procedure, when your results will be ready. Summary A mammogram is an X-ray of the breasts. This procedure can screen for and detect any changes that may indicate breast cancer. A man may have a mammogram if he has a lump or swelling in his breast tissue. If you have had a mammogram done at a different facility in the past, get the mammogram X-rays or have them sent to your current exam facility  in order to compare them. Schedule your test about 1-2 weeks after your menstrual period if you are menstruating. Ask when your test results will be ready. Make sure you get your test results. This information is not intended to replace advice given to you by your health care provider. Make sure you discuss any questions you have with your health care provider. Document Revised: 03/04/2021 Document Reviewed: 04/21/2020 Elsevier Patient Education  2023 Elsevier Inc.  

## 2022-08-18 ENCOUNTER — Ambulatory Visit: Payer: Medicare Other

## 2022-08-18 ENCOUNTER — Telehealth: Payer: Self-pay | Admitting: Family Medicine

## 2022-08-18 NOTE — Telephone Encounter (Signed)
Copied from [place] [phone]. Topic: Appointment Scheduling - Scheduling Inquiry for Clinic >> Aug 18, 2022 12:19 PM Leone Payor F wrote: Reason for CRM: Patient had a telephone AWV scheduled for today 08/18/22 at 12PM. Office is closed for lunch until 1PM. Patient wants to know does she need to reschedule or can she have her visit after lunch. Please follow up with patient.

## 2022-09-01 ENCOUNTER — Telehealth: Payer: Self-pay | Admitting: Family Medicine

## 2022-09-01 NOTE — Telephone Encounter (Signed)
Copied from [place] [phone]. Topic: Appointment Scheduling - Scheduling Inquiry for Clinic >> Sep 01, 2022 11:31 AM Chapman Fitch wrote: Reason for CRM: Pt was scheduled for a phone AWV for 2.14.24/ pt stated no one ever called and she called the office but no one could give her any answers about the missed appt / please advise

## 2022-09-22 ENCOUNTER — Telehealth: Payer: Self-pay | Admitting: Family Medicine

## 2022-09-22 NOTE — Telephone Encounter (Signed)
Copied from [place] [phone]. Topic: Medicare AWV >> Sep 22, 2022  2:22 PM Devoria Glassing wrote: Reason for CRM: Called patient to schedule Medicare Annual Wellness Visit (AWV). Left message for patient to call back and schedule Medicare Annual Wellness Visit (AWV).  Last date of AWV: NONE  Please schedule an appointment at any time with Kirke Shaggy, LPN on/after [id number] on AWV schedule. .  If any questions, please contact me.  Thank you ,  Sherol Dade; [place] Direct Dial: [phone]

## 2022-09-24 ENCOUNTER — Encounter: Payer: Self-pay | Admitting: Family Medicine

## 2022-09-29 ENCOUNTER — Telehealth: Payer: Self-pay | Admitting: Family Medicine

## 2022-09-29 NOTE — Telephone Encounter (Signed)
Copied from [place] [phone]. Topic: Referral - Request for Referral >> Sep 29, 2022  3:27 PM Tara Olson wrote: Has patient seen PCP for this complaint? No. *If NO, is insurance requiring patient see PCP for this issue before PCP can refer them? Referral for which specialty: Gastroenterologist Preferred provider/office: no preference Reason for referral: Colonoscopy

## 2022-11-02 ENCOUNTER — Telehealth: Payer: Self-pay | Admitting: Family Medicine

## 2022-11-02 ENCOUNTER — Telehealth: Payer: Self-pay

## 2022-11-02 NOTE — Telephone Encounter (Signed)
Called and left message for pt to call [phone] to get scheduled for her colonoscopy. They tried to contact her in September but couldn't reach her. She should only need to call their office to get set up.

## 2022-11-02 NOTE — Telephone Encounter (Signed)
Copied from CRM [phone]. Topic: Referral - Status >> Nov 02, 2022 10:39 AM Everette C wrote: Reason for CRM: The patient has called to follow up on a previously submitted request for a referral to a GI specialist originally requested on 09/29/22  Please contact further when possible

## 2022-11-10 DIAGNOSIS — M1611 Unilateral primary osteoarthritis, right hip: Secondary | ICD-10-CM | POA: Diagnosis not present

## 2022-11-10 DIAGNOSIS — M25551 Pain in right hip: Secondary | ICD-10-CM | POA: Diagnosis not present

## 2022-12-01 ENCOUNTER — Other Ambulatory Visit: Payer: Self-pay | Admitting: Surgery

## 2022-12-20 ENCOUNTER — Encounter
Admission: RE | Admit: 2022-12-20 | Discharge: 2022-12-20 | Disposition: A | Discharge status: Home / Self Care | Payer: Medicare Other | Source: Ambulatory Visit | Attending: Surgery | Admitting: Surgery

## 2022-12-20 ENCOUNTER — Ambulatory Visit
Admission: RE | Admit: 2022-12-20 | Discharge: 2022-12-20 | Disposition: A | Discharge status: Home / Self Care | Payer: Medicare Other | Source: Ambulatory Visit | Attending: Urgent Care | Admitting: Urgent Care

## 2022-12-20 ENCOUNTER — Inpatient Hospital Stay: Admission: RE | Admit: 2022-12-20 | Payer: Medicare Other | Source: Ambulatory Visit

## 2022-12-20 ENCOUNTER — Ambulatory Visit: Admission: RE | Admit: 2022-12-20 | Payer: Medicare Other | Source: Ambulatory Visit

## 2022-12-20 ENCOUNTER — Other Ambulatory Visit: Payer: Self-pay

## 2022-12-20 VITALS — BP 152/65 | HR 82 | Temp 98.0°F | Resp 16 | Ht 64.0 in | Wt 179.6 lb

## 2022-12-20 DIAGNOSIS — Z01812 Encounter for preprocedural laboratory examination: Secondary | ICD-10-CM

## 2022-12-20 DIAGNOSIS — M1611 Unilateral primary osteoarthritis, right hip: Secondary | ICD-10-CM | POA: Insufficient documentation

## 2022-12-20 DIAGNOSIS — Z01811 Encounter for preprocedural respiratory examination: Secondary | ICD-10-CM

## 2022-12-20 DIAGNOSIS — Z01818 Encounter for other preprocedural examination: Secondary | ICD-10-CM | POA: Insufficient documentation

## 2022-12-20 DIAGNOSIS — Z87891 Personal history of nicotine dependence: Secondary | ICD-10-CM | POA: Diagnosis not present

## 2022-12-20 DIAGNOSIS — Z01810 Encounter for preprocedural cardiovascular examination: Secondary | ICD-10-CM | POA: Diagnosis not present

## 2022-12-20 DIAGNOSIS — R0602 Shortness of breath: Secondary | ICD-10-CM

## 2022-12-20 DIAGNOSIS — R062 Wheezing: Secondary | ICD-10-CM | POA: Diagnosis not present

## 2022-12-20 HISTORY — DX: Unspecified osteoarthritis, unspecified site: M19.90

## 2022-12-20 LAB — COMPREHENSIVE METABOLIC PANEL
ALT: 25 U/L (ref 0–44)
AST: 22 U/L (ref 15–41)
Albumin: 4.3 g/dL (ref 3.5–5.0)
Alkaline Phosphatase: 54 U/L (ref 38–126)
Anion gap: 10 (ref 5–15)
BUN: 18 mg/dL (ref 8–23)
CO2: 27 mmol/L (ref 22–32)
Calcium: 9.1 mg/dL (ref 8.9–10.3)
Chloride: 99 mmol/L (ref 98–111)
Creatinine, Ser: 0.74 mg/dL (ref 0.44–1.00)
GFR, Estimated: >60 mL/min (ref >60)
Glucose, Bld: 89 mg/dL (ref 70–99)
Potassium: 4.1 mmol/L (ref 3.5–5.1)
Sodium: 136 mmol/L (ref 135–145)
Total Bilirubin: 0.7 mg/dL (ref 0.3–1.2)
Total Protein: 7.3 g/dL (ref 6.5–8.1)

## 2022-12-20 LAB — CBC WITH DIFFERENTIAL/PLATELET
Abs Immature Granulocytes: 0.02 10*3/uL (ref 0.00–0.07)
Basophils Absolute: 0.1 10*3/uL (ref 0.0–0.1)
Basophils Relative: 1 %
Eosinophils Absolute: 0.2 10*3/uL (ref 0.0–0.5)
Eosinophils Relative: 4 %
HCT: 42.9 % (ref 36.0–46.0)
Hemoglobin: 14 g/dL (ref 12.0–15.0)
Immature Granulocytes: 0 %
Lymphocytes Relative: 15 %
Lymphs Abs: 0.9 10*3/uL (ref 0.7–4.0)
MCH: 30.5 pg (ref 26.0–34.0)
MCHC: 32.6 g/dL (ref 30.0–36.0)
MCV: 93.5 fL (ref 80.0–100.0)
Monocytes Absolute: 0.5 10*3/uL (ref 0.1–1.0)
Monocytes Relative: 8 %
Neutro Abs: 4.4 10*3/uL (ref 1.7–7.7)
Neutrophils Relative %: 72 %
Platelets: 223 10*3/uL (ref 150–400)
RBC: 4.59 MIL/uL (ref 3.87–5.11)
RDW: 12.6 % (ref 11.5–15.5)
WBC: 6.1 10*3/uL (ref 4.0–10.5)
nRBC: 0 % (ref 0.0–0.2)

## 2022-12-20 LAB — URINALYSIS, ROUTINE W REFLEX MICROSCOPIC
Bilirubin Urine: NEGATIVE
Glucose, UA: NEGATIVE mg/dL
Ketones, ur: NEGATIVE mg/dL
Nitrite: NEGATIVE
Protein, ur: NEGATIVE mg/dL
Specific Gravity, Urine: 1.004 — ABNORMAL LOW (ref 1.005–1.030)
pH: 6 (ref 5.0–8.0)

## 2022-12-20 LAB — TYPE AND SCREEN
ABO/RH(D): B POS
Antibody Screen: NEGATIVE

## 2022-12-20 LAB — SURGICAL PCR SCREEN
MRSA, PCR: NEGATIVE
Staphylococcus aureus: NEGATIVE

## 2022-12-20 NOTE — Progress Notes (Addendum)
  Perioperative Services Pre-Admission/Anesthesia Testing    Date: 12/20/22  Name: Tara Olson MRN:   [id number]  Re:   Planned Surgical Procedure(s):    Case: [id number] Date/Time: 12/28/22 0715   Procedure: TOTAL HIP ARTHROPLASTY (Right: Hip)   Anesthesia type: Choice   Pre-op diagnosis: PRIMARY OSTEOARTHRITIS OF RIGHT HIP   Location: [facility] OR ROOM 03 / [facility]   Surgeons: Christena Flake, MD   Clinical Notes:  Patient is scheduled for the above procedure on 12/28/2022 with Dr. Leron Croak, MD.  In preparation for procedure, patient presented to the PAT clinic on the afternoon of 12/20/2022 for interview and testing.  During her interview, patient reporting increased shortness of breath and wheezing as of late.  Patient reports that she has a SABA (albuterol) MDI that she has been using to help control her symptoms.  Patient reports that MDI was prescribed "when she had COVID", however patient advising that she has noted that since she started using it again that she feels like her breathing is better overall.  Patient is a former smoker with a 35-pack-year smoking history.  Reportedly, she stopped smoking back in 2006.  Patient is concerned regarding her overall respiratory status with upcoming elective orthopedic procedure.  Patient has never been seen by pulmonary medicine.  Shortness of breath has not been discussed with her PCP and/or attending surgeon per her report.   Impression and Plan:  Tara Olson is scheduled for elective orthopedic procedure in the near future.  Patient with complaints of shortness of breath and wheezing.  She is using an old albuterol inhaler, which seems to help her symptoms.  Again, patient has never been seen by pulmonary medicine.  Patient is anxious about upcoming surgery with the shortness of breath that she is having.    In light of her past smoking history, patient likely has, at least to some degree, a component of mild  COPD. Patient did have a CXR back in 08/2020 that was obtained to further evaluate a persistent cough.  Study revealed no evidence for acute cardiopulmonary disease, including no infiltrates, consolidation, or effusions.    Given patient's upcoming surgery, complaints of shortness of breath/wheezing, and overall feelings of anxiety related to the aforementioned, will repeat patient's chest film to further evaluate. Patient was advised that if any significant findings were noted, she would be contacted to discuss plans for PCP follow-up with +/- pulmonary medicine consult.  Patient in agreement with plan of care as it stands at this time.  Will send note to patient's PCP Yetta Barre, MD) and primary attending surgeon Joice Lofts, MD) to make them aware of the patient's symptoms and current plan of care.  ADDENDUM:  12/20/2022 at 1539: Results from ordered 2 view CXR reviewed.  Study showed no evidence of acute cardiopulmonary disease.  No additional action/follow-up required at this time.  Quentin Mulling, MSN, APRN, FNP-C, CEN [facility]  Peri-operative Services Nurse Practitioner Phone: [phone] 12/20/22 1:51 PM  NOTE: This note has been prepared using Dragon dictation software. Despite my best ability to proofread, there is always the potential that unintentional transcriptional errors may still occur from this process.

## 2022-12-20 NOTE — Patient Instructions (Addendum)
Your procedure is scheduled on: Tuesday December 28, 2022. Report to the Registration Desk on the 1st floor of the [facility]. To find out your arrival time, please call [phone] between 1PM - 3PM on: Monday December 27, 2022. If your arrival time is 6:00 am, do not arrive before that time as the [facility] entrance doors do not open until 6:00 am.  REMEMBER: Instructions that are not followed completely may result in serious medical risk, up to and including death; or upon the discretion of your surgeon and anesthesiologist your surgery may need to be rescheduled.  Do not eat food after midnight the night before surgery.  No gum chewing or hard candies.  You may however, drink CLEAR liquids up to 2 hours before you are scheduled to arrive for your surgery. Do not drink anything within 2 hours of your scheduled arrival time.  Clear liquids include: - water  - apple juice without pulp - gatorade (not RED colors) - black coffee or tea (Do NOT add milk or creamers to the coffee or tea) Do NOT drink anything that is not on this list.   In addition, your doctor has ordered for you to drink the provided:  Ensure Pre-Surgery Clear Carbohydrate Drink  Drinking this carbohydrate drink up to two hours before surgery helps to reduce insulin resistance and improve patient outcomes. Please complete drinking 2 hours before scheduled arrival time.  One week prior to surgery: Stop Anti-inflammatories (NSAIDS) such as Advil, Aleve, Ibuprofen, Motrin, Naproxen, meloxicam, Mobic, Naprosyn and Aspirin based products such as Excedrin, Goody's Powder, BC Powder. Stop ALL OVER THE COUNTER supplements and vitamins until after surgery.Omega-3 Fatty Acids (FISH OIL), Garlic, Biotin and (MULTIVITAMIN)  You may however, continue to take Tylenol if needed for pain up until the day of surgery.  Continue taking all prescribed medications with the exception of the following:  Follow recommendations from  Cardiologist or PCP regarding stopping blood thinners.  TAKE ONLY THESE MEDICATIONS THE MORNING OF SURGERY WITH A SIP OF WATER:  None    Use inhalers on the day of surgery and bring to the hospital. albuterol (VENTOLIN HFA) 108 (90 Base) MCG/ACT inhaler   No Alcohol for 24 hours before or after surgery.  No Smoking including e-cigarettes for 24 hours before surgery.  No chewable tobacco products for at least 6 hours before surgery.  No nicotine patches on the day of surgery.  Do not use any "recreational" drugs for at least a week (preferably 2 weeks) before your surgery.  Please be advised that the combination of cocaine and anesthesia may have negative outcomes, up to and including death. If you test positive for cocaine, your surgery will be cancelled.  On the morning of surgery brush your teeth with toothpaste and water, you may rinse your mouth with mouthwash if you wish. Do not swallow any toothpaste or mouthwash.  Use CHG Soap or wipes as directed on instruction sheet.  Do not wear jewelry, make-up, hairpins, clips or nail polish.  Do not wear lotions, powders, or perfumes.   Do not shave body hair from the neck down 48 hours before surgery.  Contact lenses, hearing aids and dentures may not be worn into surgery.  Do not bring valuables to the hospital. [facility] is not responsible for any missing/lost belongings or valuables.   Notify your doctor if there is any change in your medical condition (cold, fever, infection).  Wear comfortable clothing (specific to your surgery type) to the hospital.  After surgery, you can help prevent lung complications by doing breathing exercises.  Take deep breaths and cough every 1-2 hours. Your doctor may order a device called an Incentive Spirometer to help you take deep breaths. When coughing or sneezing, hold a pillow firmly against your incision with both hands. This is called "splinting." Doing this helps protect your  incision. It also decreases belly discomfort.  If you are being admitted to the hospital overnight, leave your suitcase in the car. After surgery it may be brought to your room.  In case of increased patient census, it may be necessary for you, the patient, to continue your postoperative care in the Same Day Surgery department.  If you are being discharged the day of surgery, you will not be allowed to drive home. You will need a responsible individual to drive you home and stay with you for 24 hours after surgery.   If you are taking public transportation, you will need to have a responsible individual with you.  Please call the Pre-admissions Testing Dept. at [phone] if you have any questions about these instructions.  Surgery Visitation Policy:  Patients having surgery or a procedure may have two visitors.  Children under the age of 70 must have an adult with them who is not the patient.  Inpatient Visitation:    Visiting hours are 7 a.m. to 8 p.m. Up to four visitors are allowed at one time in a patient room. The visitors may rotate out with other people during the day.  One visitor age 38 or older may stay with the patient overnight and must be in the room by 8 p.m.  Pre-operative 5 CHG Bath Instructions   You can play a key role in reducing the risk of infection after surgery. Your skin needs to be as free of germs as possible. You can reduce the number of germs on your skin by washing with CHG (chlorhexidine gluconate) soap before surgery. CHG is an antiseptic soap that kills germs and continues to kill germs even after washing.   DO NOT use if you have an allergy to chlorhexidine/CHG or antibacterial soaps. If your skin becomes reddened or irritated, stop using the CHG and notify one of our RNs at [phone].   Please shower with the CHG soap starting 4 days before surgery using the following schedule:     Please keep in mind the following:  DO NOT shave, including  legs and underarms, starting the day of your first shower.   You may shave your face at any point before/day of surgery.  Place clean sheets on your bed the day you start using CHG soap. Use a clean washcloth (not used since being washed) for each shower. DO NOT sleep with pets once you start using the CHG.   CHG Shower Instructions:  If you choose to wash your hair and private area, wash first with your normal shampoo/soap.  After you use shampoo/soap, rinse your hair and body thoroughly to remove shampoo/soap residue.  Turn the water OFF and apply about 3 tablespoons (45 ml) of CHG soap to a CLEAN washcloth.  Apply CHG soap ONLY FROM YOUR NECK DOWN TO YOUR TOES (washing for 3-5 minutes)  DO NOT use CHG soap on face, private areas, open wounds, or sores.  Pay special attention to the area where your surgery is being performed.  If you are having back surgery, having someone wash your back for you may be helpful. Wait 2 minutes after CHG  soap is applied, then you may rinse off the CHG soap.  Pat dry with a clean towel  Put on clean clothes/pajamas   If you choose to wear lotion, please use ONLY the CHG-compatible lotions on the back of this paper.     Additional instructions for the day of surgery: DO NOT APPLY any lotions, deodorants, cologne, or perfumes.   Put on clean/comfortable clothes.  Brush your teeth.  Ask your nurse before applying any prescription medications to the skin.      CHG Compatible Lotions   Aveeno Moisturizing lotion  Cetaphil Moisturizing Cream  Cetaphil Moisturizing Lotion  Clairol Herbal Essence Moisturizing Lotion, Dry Skin  Clairol Herbal Essence Moisturizing Lotion, Extra Dry Skin  Clairol Herbal Essence Moisturizing Lotion, Normal Skin  Curel Age Defying Therapeutic Moisturizing Lotion with Alpha Hydroxy  Curel Extreme Care Body Lotion  Curel Soothing Hands Moisturizing Hand Lotion  Curel Therapeutic Moisturizing Cream, Fragrance-Free  Curel  Therapeutic Moisturizing Lotion, Fragrance-Free  Curel Therapeutic Moisturizing Lotion, Original Formula  Eucerin Daily Replenishing Lotion  Eucerin Dry Skin Therapy Plus Alpha Hydroxy Crme  Eucerin Dry Skin Therapy Plus Alpha Hydroxy Lotion  Eucerin Original Crme  Eucerin Original Lotion  Eucerin Plus Crme Eucerin Plus Lotion  Eucerin TriLipid Replenishing Lotion  Keri Anti-Bacterial Hand Lotion  Keri Deep Conditioning Original Lotion Dry Skin Formula Softly Scented  Keri Deep Conditioning Original Lotion, Fragrance Free Sensitive Skin Formula  Keri Lotion Fast Absorbing Fragrance Free Sensitive Skin Formula  Keri Lotion Fast Absorbing Softly Scented Dry Skin Formula  Keri Original Lotion  Keri Skin Renewal Lotion Keri Silky Smooth Lotion  Keri Silky Smooth Sensitive Skin Lotion  Nivea Body Creamy Conditioning Oil  Nivea Body Extra Enriched Lotion  Nivea Body Original Lotion  Nivea Body Sheer Moisturizing Lotion Nivea Crme  Nivea Skin Firming Lotion  NutraDerm 30 Skin Lotion  NutraDerm Skin Lotion  NutraDerm Therapeutic Skin Cream  NutraDerm Therapeutic Skin Lotion  ProShield Protective Hand Cream  Provon moisturizing lotion How to Use an Incentive Spirometer  An incentive spirometer is a tool that measures how well you are filling your lungs with each breath. Learning to take long, deep breaths using this tool can help you keep your lungs clear and active. This may help to reverse or lessen your chance of developing breathing (pulmonary) problems, especially infection. You may be asked to use a spirometer: After a surgery. If you have a lung problem or a history of smoking. After a long period of time when you have been unable to move or be active. If the spirometer includes an indicator to show the highest number that you have reached, your health care provider or respiratory therapist will help you set a goal. Keep a log of your progress as told by your health care  provider. What are the risks? Breathing too quickly may cause dizziness or cause you to pass out. Take your time so you do not get dizzy or light-headed. If you are in pain, you may need to take pain medicine before doing incentive spirometry. It is harder to take a deep breath if you are having pain. How to use your incentive spirometer  Sit up on the edge of your bed or on a chair. Hold the incentive spirometer so that it is in an upright position. Before you use the spirometer, breathe out normally. Place the mouthpiece in your mouth. Make sure your lips are closed tightly around it. Breathe in slowly and as deeply as you can  through your mouth, causing the piston or the ball to rise toward the top of the chamber. Hold your breath for 3-5 seconds, or for as long as possible. If the spirometer includes a coach indicator, use this to guide you in breathing. Slow down your breathing if the indicator goes above the marked areas. Remove the mouthpiece from your mouth and breathe out normally. The piston or ball will return to the bottom of the chamber. Rest for a few seconds, then repeat the steps 10 or more times. Take your time and take a few normal breaths between deep breaths so that you do not get dizzy or light-headed. Do this every 1-2 hours when you are awake. If the spirometer includes a goal marker to show the highest number you have reached (best effort), use this as a goal to work toward during each repetition. After each set of 10 deep breaths, cough a few times. This will help to make sure that your lungs are clear. If you have an incision on your chest or abdomen from surgery, place a pillow or a rolled-up towel firmly against the incision when you cough. This can help to reduce pain while taking deep breaths and coughing. General tips When you are able to get out of bed: Walk around often. Continue to take deep breaths and cough in order to clear your lungs. Keep using the  incentive spirometer until your health care provider says it is okay to stop using it. If you have been in the hospital, you may be told to keep using the spirometer at home. Contact a health care provider if: You are having difficulty using the spirometer. You have trouble using the spirometer as often as instructed. Your pain medicine is not giving enough relief for you to use the spirometer as told. You have a fever. Get help right away if: You develop shortness of breath. You develop a cough with bloody mucus from the lungs. You have fluid or blood coming from an incision site after you cough. Summary An incentive spirometer is a tool that can help you learn to take long, deep breaths to keep your lungs clear and active. You may be asked to use a spirometer after a surgery, if you have a lung problem or a history of smoking, or if you have been inactive for a long period of time. Use your incentive spirometer as instructed every 1-2 hours while you are awake. If you have an incision on your chest or abdomen, place a pillow or a rolled-up towel firmly against your incision when you cough. This will help to reduce pain. Get help right away if you have shortness of breath, you cough up bloody mucus, or blood comes from your incision when you cough. This information is not intended to replace advice given to you by your health care provider. Make sure you discuss any questions you have with your health care provider. Document Revised: 09/10/2019 Document Reviewed: 09/10/2019 Elsevier Patient Education  2023 [employer].  Please go to the following website to access important education materials concerning your upcoming joint replacement.                                   [email or web address]

## 2022-12-28 ENCOUNTER — Ambulatory Visit: Payer: Medicare Other | Admitting: Urgent Care

## 2022-12-28 ENCOUNTER — Encounter
Admission: RE | Disposition: A | Discharge status: Home / Self Care | Payer: Self-pay | Source: Home / Self Care | Attending: Surgery

## 2022-12-28 ENCOUNTER — Ambulatory Visit
Admission: RE | Admit: 2022-12-28 | Discharge: 2022-12-29 | Disposition: A | Discharge status: Home / Self Care | Payer: Medicare Other | Attending: Surgery | Admitting: Surgery

## 2022-12-28 ENCOUNTER — Encounter: Payer: Self-pay | Admitting: Surgery

## 2022-12-28 ENCOUNTER — Ambulatory Visit: Payer: Medicare Other | Admitting: Anesthesiology

## 2022-12-28 ENCOUNTER — Ambulatory Visit: Payer: Medicare Other

## 2022-12-28 ENCOUNTER — Other Ambulatory Visit: Payer: Self-pay

## 2022-12-28 DIAGNOSIS — M1611 Unilateral primary osteoarthritis, right hip: Secondary | ICD-10-CM | POA: Diagnosis not present

## 2022-12-28 DIAGNOSIS — K219 Gastro-esophageal reflux disease without esophagitis: Secondary | ICD-10-CM | POA: Diagnosis not present

## 2022-12-28 DIAGNOSIS — Z87891 Personal history of nicotine dependence: Secondary | ICD-10-CM | POA: Diagnosis not present

## 2022-12-28 DIAGNOSIS — I493 Ventricular premature depolarization: Secondary | ICD-10-CM | POA: Insufficient documentation

## 2022-12-28 DIAGNOSIS — Z96641 Presence of right artificial hip joint: Secondary | ICD-10-CM | POA: Diagnosis not present

## 2022-12-28 DIAGNOSIS — Z471 Aftercare following joint replacement surgery: Secondary | ICD-10-CM | POA: Diagnosis not present

## 2022-12-28 DIAGNOSIS — G4730 Sleep apnea, unspecified: Secondary | ICD-10-CM | POA: Diagnosis not present

## 2022-12-28 DIAGNOSIS — F32A Depression, unspecified: Secondary | ICD-10-CM | POA: Insufficient documentation

## 2022-12-28 DIAGNOSIS — Z79899 Other long term (current) drug therapy: Secondary | ICD-10-CM | POA: Insufficient documentation

## 2022-12-28 HISTORY — PX: TOTAL HIP ARTHROPLASTY: SHX124

## 2022-12-28 LAB — ABO/RH: ABO/RH(D): B POS

## 2022-12-28 SURGERY — ARTHROPLASTY, HIP, TOTAL,POSTERIOR APPROACH
Anesthesia: Spinal | Site: Hip | Laterality: Right

## 2022-12-28 MED ORDER — PHENYLEPHRINE HCL-NACL 20-0.9 MG/250ML-% IV SOLN
INTRAVENOUS | Status: DC | PRN
  Administered 2022-12-28: 30 ug/min via INTRAVENOUS

## 2022-12-28 MED ORDER — CEFAZOLIN SODIUM-DEXTROSE 2-4 GM/100ML-% IV SOLN
2.0000 g | Freq: Once | INTRAVENOUS | Status: AC
  Administered 2022-12-28: 2 g via INTRAVENOUS

## 2022-12-28 MED ORDER — FLEET ENEMA 7-19 GM/118ML RE ENEM: 1.0000 | ENEMA | Freq: Once | RECTAL | Status: DC | PRN

## 2022-12-28 MED ORDER — METOCLOPRAMIDE HCL 5 MG/ML IJ SOLN: 5.0000 mg | Freq: Three times a day (TID) | INTRAMUSCULAR | Status: DC | PRN

## 2022-12-28 MED ORDER — TRIAMCINOLONE ACETONIDE 40 MG/ML IJ SUSP
INTRAMUSCULAR | Status: AC
  Filled 2022-12-28: qty 1

## 2022-12-28 MED ORDER — BUPIVACAINE LIPOSOME 1.3 % IJ SUSP
INTRAMUSCULAR | Status: AC
  Filled 2022-12-28: qty 20

## 2022-12-28 MED ORDER — ACETAMINOPHEN 500 MG PO TABS
ORAL_TABLET | ORAL | Status: AC
  Filled 2022-12-28: qty 2

## 2022-12-28 MED ORDER — PROPOFOL 500 MG/50ML IV EMUL
INTRAVENOUS | Status: DC | PRN
  Administered 2022-12-28: 100 ug/kg/min via INTRAVENOUS

## 2022-12-28 MED ORDER — CEFAZOLIN SODIUM-DEXTROSE 2-4 GM/100ML-% IV SOLN
INTRAVENOUS | Status: AC
  Filled 2022-12-28: qty 100

## 2022-12-28 MED ORDER — PROPOFOL 500 MG/50ML IV EMUL: INTRAVENOUS | Status: DC | PRN

## 2022-12-28 MED ORDER — KETOROLAC TROMETHAMINE 30 MG/ML IJ SOLN
INTRAMUSCULAR | Status: DC | PRN
  Administered 2022-12-28: 30 mg via INTRAMUSCULAR

## 2022-12-28 MED ORDER — OXYCODONE HCL 5 MG/5ML PO SOLN: 5.0000 mg | Freq: Once | ORAL | Status: DC | PRN

## 2022-12-28 MED ORDER — FAMOTIDINE 20 MG PO TABS
20.0000 mg | ORAL_TABLET | Freq: Once | ORAL | Status: AC
  Administered 2022-12-28: 20 mg via ORAL

## 2022-12-28 MED ORDER — ONDANSETRON HCL 4 MG/2ML IJ SOLN: 4.0000 mg | Freq: Four times a day (QID) | INTRAMUSCULAR | Status: DC | PRN

## 2022-12-28 MED ORDER — TRANEXAMIC ACID 1000 MG/10ML IV SOLN
INTRAVENOUS | Status: AC
  Filled 2022-12-28: qty 10

## 2022-12-28 MED ORDER — MIDAZOLAM HCL 2 MG/2ML IJ SOLN
INTRAMUSCULAR | Status: AC
  Filled 2022-12-28: qty 2

## 2022-12-28 MED ORDER — ADULT MULTIVITAMIN W/MINERALS CH
1.0000 | ORAL_TABLET | Freq: Every day | ORAL | Status: DC
  Administered 2022-12-29: 1 via ORAL

## 2022-12-28 MED ORDER — METOCLOPRAMIDE HCL 10 MG PO TABS: 5.0000 mg | ORAL_TABLET | Freq: Three times a day (TID) | ORAL | Status: DC | PRN

## 2022-12-28 MED ORDER — KETOROLAC TROMETHAMINE 15 MG/ML IJ SOLN
INTRAMUSCULAR | Status: AC
  Filled 2022-12-28: qty 1

## 2022-12-28 MED ORDER — ACETAMINOPHEN 325 MG PO TABS: 325.0000 mg | ORAL_TABLET | Freq: Four times a day (QID) | ORAL | Status: DC | PRN

## 2022-12-28 MED ORDER — DIPHENHYDRAMINE HCL 12.5 MG/5ML PO ELIX: 12.5000 mg | ORAL_SOLUTION | ORAL | Status: DC | PRN

## 2022-12-28 MED ORDER — DOCUSATE SODIUM 100 MG PO CAPS
100.0000 mg | ORAL_CAPSULE | Freq: Two times a day (BID) | ORAL | Status: DC
  Administered 2022-12-28 – 2022-12-29 (×2): 100 mg via ORAL

## 2022-12-28 MED ORDER — PROPOFOL 1000 MG/100ML IV EMUL
INTRAVENOUS | Status: AC
  Filled 2022-12-28: qty 100

## 2022-12-28 MED ORDER — ONDANSETRON HCL 4 MG/2ML IJ SOLN
INTRAMUSCULAR | Status: AC
  Filled 2022-12-28: qty 2

## 2022-12-28 MED ORDER — APIXABAN 2.5 MG PO TABS
2.5000 mg | ORAL_TABLET | Freq: Two times a day (BID) | ORAL | Status: DC
  Administered 2022-12-29: 2.5 mg via ORAL

## 2022-12-28 MED ORDER — BUPIVACAINE HCL (PF) 0.5 % IJ SOLN
INTRAMUSCULAR | Status: DC | PRN
  Administered 2022-12-28: 2.6 mL

## 2022-12-28 MED ORDER — TRANEXAMIC ACID 1000 MG/10ML IV SOLN
INTRAVENOUS | Status: DC | PRN
  Administered 2022-12-28: 1000 mg via TOPICAL

## 2022-12-28 MED ORDER — ACETAMINOPHEN 10 MG/ML IV SOLN
INTRAVENOUS | Status: AC
  Filled 2022-12-28: qty 100

## 2022-12-28 MED ORDER — HYDROMORPHONE HCL 1 MG/ML IJ SOLN: 0.2500 mg | INTRAMUSCULAR | Status: DC | PRN

## 2022-12-28 MED ORDER — BUPIVACAINE HCL (PF) 0.5 % IJ SOLN
INTRAMUSCULAR | Status: AC
  Filled 2022-12-28: qty 30

## 2022-12-28 MED ORDER — OXYCODONE HCL 5 MG PO TABS
ORAL_TABLET | ORAL | Status: AC
  Filled 2022-12-28: qty 1

## 2022-12-28 MED ORDER — ORAL CARE MOUTH RINSE: 15.0000 mL | Freq: Once | OROMUCOSAL | Status: AC

## 2022-12-28 MED ORDER — ONDANSETRON HCL 4 MG PO TABS: 4.0000 mg | ORAL_TABLET | Freq: Four times a day (QID) | ORAL | Status: DC | PRN

## 2022-12-28 MED ORDER — ACETAMINOPHEN 10 MG/ML IV SOLN
INTRAVENOUS | Status: DC | PRN
  Administered 2022-12-28: 1000 mg via INTRAVENOUS

## 2022-12-28 MED ORDER — PHENYLEPHRINE 80 MCG/ML (10ML) SYRINGE FOR IV PUSH (FOR BLOOD PRESSURE SUPPORT)
PREFILLED_SYRINGE | INTRAVENOUS | Status: AC
  Filled 2022-12-28: qty 10

## 2022-12-28 MED ORDER — LACTATED RINGERS IV SOLN: INTRAVENOUS | Status: DC

## 2022-12-28 MED ORDER — SODIUM CHLORIDE 0.9 % IV SOLN
INTRAVENOUS | Status: DC | PRN
  Administered 2022-12-28: 60 mL

## 2022-12-28 MED ORDER — SODIUM CHLORIDE FLUSH 0.9 % IV SOLN
INTRAVENOUS | Status: AC
  Filled 2022-12-28: qty 40

## 2022-12-28 MED ORDER — FENTANYL CITRATE (PF) 100 MCG/2ML IJ SOLN: 25.0000 ug | INTRAMUSCULAR | Status: DC | PRN

## 2022-12-28 MED ORDER — DEXAMETHASONE SODIUM PHOSPHATE 10 MG/ML IJ SOLN
INTRAMUSCULAR | Status: DC | PRN
  Administered 2022-12-28: 10 mg via INTRAVENOUS

## 2022-12-28 MED ORDER — BUPIVACAINE-EPINEPHRINE (PF) 0.5% -1:200000 IJ SOLN
INTRAMUSCULAR | Status: DC | PRN
  Administered 2022-12-28: 30 mL via PERINEURAL

## 2022-12-28 MED ORDER — SODIUM CHLORIDE 0.9 % IR SOLN
Status: DC | PRN
  Administered 2022-12-28: 3000 mL

## 2022-12-28 MED ORDER — PHENYLEPHRINE HCL (PRESSORS) 10 MG/ML IV SOLN
INTRAVENOUS | Status: AC
  Filled 2022-12-28: qty 1

## 2022-12-28 MED ORDER — OXYCODONE HCL 5 MG PO TABS: 5.0000 mg | ORAL_TABLET | Freq: Once | ORAL | Status: DC | PRN

## 2022-12-28 MED ORDER — MIDAZOLAM HCL 5 MG/5ML IJ SOLN
INTRAMUSCULAR | Status: DC | PRN
  Administered 2022-12-28: 2 mg via INTRAVENOUS

## 2022-12-28 MED ORDER — DEXAMETHASONE SODIUM PHOSPHATE 10 MG/ML IJ SOLN
INTRAMUSCULAR | Status: AC
  Filled 2022-12-28: qty 1

## 2022-12-28 MED ORDER — TRIAMCINOLONE ACETONIDE 40 MG/ML IJ SUSP
INTRAMUSCULAR | Status: DC | PRN
  Administered 2022-12-28: 80 mg

## 2022-12-28 MED ORDER — OXYCODONE HCL 5 MG PO TABS
5.0000 mg | ORAL_TABLET | ORAL | Status: DC | PRN
  Administered 2022-12-28 – 2022-12-29 (×2): 5 mg via ORAL

## 2022-12-28 MED ORDER — PROPOFOL 10 MG/ML IV BOLUS
INTRAVENOUS | Status: DC | PRN
  Administered 2022-12-28: 30 mg via INTRAVENOUS
  Administered 2022-12-28: 20 mg via INTRAVENOUS
  Administered 2022-12-28: 10 mg via INTRAVENOUS
  Administered 2022-12-28: 30 mg via INTRAVENOUS
  Administered 2022-12-28: 10 mg via INTRAVENOUS
  Administered 2022-12-28: 40 mg via INTRAVENOUS

## 2022-12-28 MED ORDER — ONDANSETRON HCL 4 MG/2ML IJ SOLN
INTRAMUSCULAR | Status: DC | PRN
  Administered 2022-12-28: 4 mg via INTRAVENOUS

## 2022-12-28 MED ORDER — CHLORHEXIDINE GLUCONATE 0.12 % MT SOLN
15.0000 mL | Freq: Once | OROMUCOSAL | Status: AC
  Administered 2022-12-28: 15 mL via OROMUCOSAL

## 2022-12-28 MED ORDER — 0.9 % SODIUM CHLORIDE (POUR BTL) OPTIME
TOPICAL | Status: DC | PRN
  Administered 2022-12-28: 500 mL

## 2022-12-28 MED ORDER — SODIUM CHLORIDE 0.9 % IV SOLN: INTRAVENOUS | Status: DC

## 2022-12-28 MED ORDER — BISACODYL 10 MG RE SUPP: 10.0000 mg | Freq: Every day | RECTAL | Status: DC | PRN

## 2022-12-28 MED ORDER — CEFAZOLIN SODIUM-DEXTROSE 2-4 GM/100ML-% IV SOLN
2.0000 g | Freq: Four times a day (QID) | INTRAVENOUS | Status: AC
  Administered 2022-12-28 – 2022-12-29 (×3): 2 g via INTRAVENOUS

## 2022-12-28 MED ORDER — PHENYLEPHRINE HCL-NACL 20-0.9 MG/250ML-% IV SOLN
INTRAVENOUS | Status: AC
  Filled 2022-12-28: qty 250

## 2022-12-28 MED ORDER — KETOROLAC TROMETHAMINE 15 MG/ML IJ SOLN: 15.0000 mg | Freq: Once | INTRAMUSCULAR | Status: DC

## 2022-12-28 MED ORDER — BUPIVACAINE HCL (PF) 0.5 % IJ SOLN
INTRAMUSCULAR | Status: AC
  Filled 2022-12-28: qty 10

## 2022-12-28 MED ORDER — DOCUSATE SODIUM 100 MG PO CAPS
ORAL_CAPSULE | ORAL | Status: AC
  Filled 2022-12-28: qty 1

## 2022-12-28 MED ORDER — EPINEPHRINE PF 1 MG/ML IJ SOLN
INTRAMUSCULAR | Status: AC
  Filled 2022-12-28: qty 1

## 2022-12-28 MED ORDER — CHLORHEXIDINE GLUCONATE 0.12 % MT SOLN
OROMUCOSAL | Status: AC
  Filled 2022-12-28: qty 15

## 2022-12-28 MED ORDER — KETOROLAC TROMETHAMINE 15 MG/ML IJ SOLN
7.5000 mg | Freq: Four times a day (QID) | INTRAMUSCULAR | Status: DC
  Administered 2022-12-28 – 2022-12-29 (×3): 7.5 mg via INTRAVENOUS

## 2022-12-28 MED ORDER — ACETAMINOPHEN 500 MG PO TABS
1000.0000 mg | ORAL_TABLET | Freq: Four times a day (QID) | ORAL | Status: DC
  Administered 2022-12-28 – 2022-12-29 (×3): 1000 mg via ORAL

## 2022-12-28 MED ORDER — PHENYLEPHRINE HCL (PRESSORS) 10 MG/ML IV SOLN
INTRAVENOUS | Status: DC | PRN
  Administered 2022-12-28: 80 ug via INTRAVENOUS
  Administered 2022-12-28 (×2): 40 ug via INTRAVENOUS

## 2022-12-28 MED ORDER — MAGNESIUM HYDROXIDE 400 MG/5ML PO SUSP: 30.0000 mL | Freq: Every day | ORAL | Status: DC | PRN

## 2022-12-28 MED ORDER — FAMOTIDINE 20 MG PO TABS
ORAL_TABLET | ORAL | Status: AC
  Filled 2022-12-28: qty 1

## 2022-12-28 MED ORDER — ALBUTEROL SULFATE (2.5 MG/3ML) 0.083% IN NEBU: 3.0000 mL | INHALATION_SOLUTION | RESPIRATORY_TRACT | Status: DC | PRN

## 2022-12-28 SURGICAL SUPPLY — 53 items
APL PRP STRL LF DISP 70% ISPRP (MISCELLANEOUS) ×2
BLADE SAGITTAL WIDE XTHICK NO (BLADE) ×1 IMPLANT
BLADE SURG SZ20 CARB STEEL (BLADE) ×1 IMPLANT
CHLORAPREP W/TINT 26 (MISCELLANEOUS) ×1 IMPLANT
DRAPE 3/4 80X56 (DRAPES) ×1 IMPLANT
DRAPE IMP U-DRAPE 54X76 (DRAPES) IMPLANT
DRAPE INCISE IOBAN 66X60 STRL (DRAPES) ×1 IMPLANT
DRAPE SURG 17X11 SM STRL (DRAPES) ×2 IMPLANT
DRSG MEPILEX SACRM 8.7X9.8 (GAUZE/BANDAGES/DRESSINGS) ×1 IMPLANT
DRSG OPSITE POSTOP 4X10 (GAUZE/BANDAGES/DRESSINGS) ×1 IMPLANT
ELECT CAUTERY BLADE 6.4 (BLADE) ×1 IMPLANT
GAUZE 4X4 16PLY ~~LOC~~+RFID DBL (SPONGE) ×1 IMPLANT
GAUZE XEROFORM 1X8 LF (GAUZE/BANDAGES/DRESSINGS) ×1 IMPLANT
GLOVE BIO SURGEON STRL SZ7.5 (GLOVE) ×4 IMPLANT
GLOVE BIO SURGEON STRL SZ8 (GLOVE) ×4 IMPLANT
GLOVE BIOGEL PI IND STRL 8 (GLOVE) ×1 IMPLANT
GLOVE INDICATOR 8.0 STRL GRN (GLOVE) ×1 IMPLANT
GOWN STRL REUS W/ TWL LRG LVL3 (GOWN DISPOSABLE) ×1 IMPLANT
GOWN STRL REUS W/ TWL XL LVL3 (GOWN DISPOSABLE) ×1 IMPLANT
GOWN STRL REUS W/TWL LRG LVL3 (GOWN DISPOSABLE) ×3
GOWN STRL REUS W/TWL XL LVL3 (GOWN DISPOSABLE) ×1
HANDLE YANKAUER SUCT OPEN TIP (MISCELLANEOUS) ×1 IMPLANT
HEAD CERAMIC BIOLOX 36 (Head) IMPLANT
HIP SHELL ACETAB 3H 50MM (Hips) ×1 IMPLANT
HOLSTER ELECTROSUGICAL PENCIL (MISCELLANEOUS) ×1 IMPLANT
HOOD PEEL AWAY T7 (MISCELLANEOUS) ×3 IMPLANT
IV NS IRRIG 3000ML ARTHROMATIC (IV SOLUTION) ×1 IMPLANT
KIT TURNOVER KIT A (KITS) ×1 IMPLANT
LINER ACE G7 36 SZ D HIGH WALL (Liner) IMPLANT
MANIFOLD NEPTUNE II (INSTRUMENTS) ×1 IMPLANT
NDL FILTER BLUNT 18X1 1/2 (NEEDLE) ×1 IMPLANT
NDL SAFETY ECLIP 18X1.5 (MISCELLANEOUS) ×2 IMPLANT
NDL SPNL 20GX3.5 QUINCKE YW (NEEDLE) ×1 IMPLANT
NEEDLE FILTER BLUNT 18X1 1/2 (NEEDLE) ×1 IMPLANT
NEEDLE SPNL 20GX3.5 QUINCKE YW (NEEDLE) ×1 IMPLANT
PACK HIP PROSTHESIS (MISCELLANEOUS) ×1 IMPLANT
PENCIL SMOKE EVACUATOR (MISCELLANEOUS) ×1 IMPLANT
PULSAVAC PLUS IRRIG FAN TIP (DISPOSABLE) ×1
SHELL ACETAB HIP 3H 50MM (Hips) IMPLANT
SPONGE T-LAP 18X18 ~~LOC~~+RFID (SPONGE) ×5 IMPLANT
STAPLER SKIN PROX 35W (STAPLE) ×1 IMPLANT
STEM FEMORAL SZ12 140MM (Stem) IMPLANT
SUT TICRON 2-0 30IN 311381 (SUTURE) ×3 IMPLANT
SUT VIC AB 0 CT1 36 (SUTURE) ×1 IMPLANT
SUT VIC AB 1 CT1 36 (SUTURE) ×1 IMPLANT
SUT VIC AB 2-0 CT1 (SUTURE) ×3 IMPLANT
SYR 10ML LL (SYRINGE) ×1 IMPLANT
SYR 20ML LL LF (SYRINGE) ×1 IMPLANT
SYR 30ML LL (SYRINGE) ×2 IMPLANT
TIP FAN IRRIG PULSAVAC PLUS (DISPOSABLE) ×1 IMPLANT
TRAP FLUID SMOKE EVACUATOR (MISCELLANEOUS) ×2 IMPLANT
WATER STERILE IRR 1000ML POUR (IV SOLUTION) ×1 IMPLANT
WATER STERILE IRR 500ML POUR (IV SOLUTION) ×1 IMPLANT

## 2022-12-28 NOTE — Plan of Care (Signed)
  Problem: Pain Management: Goal: Pain level will decrease with appropriate interventions Outcome: Progressing   Problem: Skin Integrity: Goal: Will show signs of wound healing Outcome: Progressing   

## 2022-12-28 NOTE — Transfer of Care (Signed)
Immediate Anesthesia Transfer of Care Note  Patient: Tara Olson  Procedure(s) Performed: TOTAL HIP ARTHROPLASTY (Right: Hip)  Patient Location: PACU  Anesthesia Type:General  Level of Consciousness: awake, alert , and oriented  Airway & Oxygen Therapy: Patient Spontanous Breathing  Post-op Assessment: Report given to RN and Post -op Vital signs reviewed and stable  Post vital signs: Reviewed and stable  Last Vitals:  Vitals Value Taken Time  BP 112/65 12/28/22 1321  Temp 36.3 C 12/28/22 1321  Pulse 68 12/28/22 1321  Resp 12 12/28/22 1321  SpO2 96 % 12/28/22 1321    Last Pain:  Vitals:   12/28/22 1321  TempSrc:   PainSc: Asleep         Complications: No notable events documented.

## 2022-12-28 NOTE — Anesthesia Procedure Notes (Signed)
Spinal  Patient location during procedure: OR Start time: 12/28/2022 11:00 AM End time: 12/28/2022 11:07 AM Reason for block: surgical anesthesia Staffing Performed: resident/CRNA  Anesthesiologist: Piscitello, Cleda Mccreedy, MD Resident/CRNA: Demitris Pokorny, [place], CRNA Performed by: Manu Rubey, [place], CRNA Authorized by: Rosaria Ferries, MD   Preanesthetic Checklist Completed: patient identified, IV checked, site marked, risks and benefits discussed, surgical consent, monitors and equipment checked, pre-op evaluation and timeout performed Spinal Block Patient position: sitting Prep: Betadine Patient monitoring: heart rate, continuous pulse ox, blood pressure and cardiac monitor Approach: midline Location: L4-5 Injection technique: single-shot Needle Needle type: Whitacre and Introducer  Needle gauge: 24 G Needle length: 9 cm Assessment Sensory level: T6 Events: CSF return Additional Notes Negative paresthesia. Negative blood return. Positive free-flowing CSF. Expiration date of kit checked and confirmed. Patient tolerated procedure well, without complications.

## 2022-12-28 NOTE — Anesthesia Preprocedure Evaluation (Signed)
Anesthesia Evaluation  Patient identified by MRN, date of birth, ID band Patient awake    Reviewed: Allergy & Precautions, NPO status , Patient's Chart, lab work & pertinent test results  History of Anesthesia Complications Negative for: history of anesthetic complications  Airway Mallampati: III  TM Distance: <3 FB Neck ROM: full    Dental  (+) Chipped   Pulmonary neg shortness of breath, sleep apnea and Continuous Positive Airway Pressure Ventilation , former smoker   Pulmonary exam normal        Cardiovascular Exercise Tolerance: Good (-) angina (-) Past MI Normal cardiovascular exam     Neuro/Psych  PSYCHIATRIC DISORDERS      negative neurological ROS     GI/Hepatic Neg liver ROS,GERD  Controlled,,  Endo/Other  negative endocrine ROS    Renal/GU      Musculoskeletal   Abdominal   Peds  Hematology negative hematology ROS (+)   Anesthesia Other Findings Past Medical History: No date: Arthritis No date: Depression No date: Dyspnea No date: GERD (gastroesophageal reflux disease) No date: Sleep apnea  Past Surgical History: 08/04/2020: BREAST BIOPSY; Right     Comment:  Ultrasound biopsy 05/31/2022: BREAST BIOPSY; Right     Comment:  Right Breast distortion Ribbon Clip - path pending 05/31/2022: BREAST BIOPSY; Right     Comment:  MM RT BREAST BX W LOC DEV 1ST LESION IMAGE BX SPEC               STEREO GUIDE 05/31/2022 [facility] 1989: EXPLORATORY LAPAROTOMY 12/22/2021: SHOULDER ARTHROSCOPY WITH SUBACROMIAL DECOMPRESSION,  ROTATOR CUFF REPAIR AND BICEP TENDON REPAIR; Right     Comment:  Procedure: RIGHT SHOULDER ARTHROSCOPY WITH DEBRIDEMENT,               DECOMPRESSION, ROTATOR CUFF REPAIR, AND BICEPS TENODESIS;              Surgeon: Christena Flake, MD;  Location: [facility];                Service: Orthopedics;  Laterality: Right;  BMI    Body Mass Index: 30.04 kg/m       Reproductive/Obstetrics negative OB ROS                             Anesthesia Physical Anesthesia Plan  ASA: 3  Anesthesia Plan: Spinal   Post-op Pain Management:    Induction:   PONV Risk Score and Plan:   Airway Management Planned: Natural Airway and Nasal Cannula  Additional Equipment:   Intra-op Plan:   Post-operative Plan:   Informed Consent: I have reviewed the patients History and Physical, chart, labs and discussed the procedure including the risks, benefits and alternatives for the proposed anesthesia with the patient or authorized representative who has indicated his/her understanding and acceptance.     Dental Advisory Given  Plan Discussed with: Anesthesiologist, CRNA and Surgeon  Anesthesia Plan Comments: (Patient reports no bleeding problems and no anticoagulant use.  Plan for spinal with backup GA  Patient consented for risks of anesthesia including but not limited to:  - adverse reactions to medications - damage to eyes, teeth, lips or other oral mucosa - nerve damage due to positioning  - risk of bleeding, infection and or nerve damage from spinal that could lead to paralysis - risk of headache or failed spinal - damage to teeth, lips or other oral mucosa - sore throat or hoarseness - damage to heart,  brain, nerves, lungs, other parts of body or loss of life  Patient voiced understanding.)       Anesthesia Quick Evaluation

## 2022-12-28 NOTE — Op Note (Signed)
12/28/2022  12:59 PM  Patient:   Tara Olson  Pre-Op Diagnosis:   Degenerative joint disease, right hip.  Post-Op Diagnosis:   Same.  Procedure:   Right total hip arthroplasty.  Surgeon:   Maryagnes Amos, MD  Assistant:   Horris Latino, PA-C; Brandy Hale, PA-S  Anesthesia:   Spinal  Findings:   As above.  Complications:   None  EBL:   100 cc  Fluids:   550 cc crystalloid  UOP:   None  TT:   None  Drains:   None  Closure:   Staples  Implants:   Biomet press-fit system with a #12 laterally offset Echo femoral stem, a 50 mm acetabular shell with an E-poly hi-wall liner, and a 36 mm ceramic head with a -3 mm neck.  Brief Clinical Note:   The patient is a 72 year old female with a history of progressively worsening right hip/groin pain. Her symptoms have progressed despite medications, activity modification, etc. Her history and examination consistent with degenerative joint disease confirmed by plain radiographs. The patient presents at this time for a right total hip arthroplasty.   Procedure:   The patient was brought into the operating room. After adequate spinal anesthesia was obtained, the patient was repositioned in the left lateral decubitus position and secured using a lateral hip positioner. The right hip and lower extremity were prepped with ChloroPrep solution before being draped sterilely. Preoperative antibiotics were administered. A timeout was performed to verify the appropriate surgical site.    A standard posterior approach to the hip was made through an approximately 5-6 inch incision. The incision was carried down through the subcutaneous tissues to expose the gluteal fascia and proximal end of the iliotibial band. These structures were split the length of the incision and the Charnley self-retaining hip retractor placed. The bursal tissues were swept posteriorly to expose the short external rotators. The anterior border of the piriformis tendon was  identified and this plane developed down through the capsule to enter the joint. A flap of tissue was elevated off the posterior aspect of the femoral neck and greater trochanter and retracted posteriorly. This flap included the piriformis tendon, the short external rotators, and the posterior capsule. The soft tissues were elevated off the lateral aspect of the ilium and a large Steinmann pin placed bicortically.   With the right leg aligned over the left, a drill bit was placed into the greater trochanter parallel to the Steinmann pin and the distance between these two pins measured in order to optimize leg lengths postoperatively. The drill bit was removed and the hip dislocated. The piriformis fossa was debrided of soft tissues before the intramedullary canal was accessed through this point using a triple step reamer. The canal was reamed sequentially beginning with a #7 tapered reamer and progressing to a #12 tapered reamer. This provided excellent circumferential chatter. Using the appropriate guide, a femoral neck cut was made 10-12 mm above the lesser trochanter. The femoral head was removed.  Attention was directed to the acetabular side. The labrum was debrided circumferentially before the ligamentum teres was removed using a large curette. A line was drawn on the drapes corresponding to the native version of the acetabulum. This line was used as a guide while the acetabulum was reamed sequentially beginning with a 43 mm reamer and progressing to a 49 mm reamer. This provided excellent circumferential chatter. The 49 mm trial acetabulum was positioned and found to fit quite well. Therefore, the 50 mm acetabular  shell was selected and impacted into place with care taken to maintain the appropriate version. The trial high wall liner was inserted.  Attention was redirected to the femoral side. A box osteotome was used to establish version before the canal was broached sequentially beginning with a #7  broach and progressing to a #12 broach. This was left in place and several trial reductions performed using both the standard and laterally offset neck options, as well as the -6 mm, the -3 mm, and the +0 mm neck lengths. After removing the trial components, the "manhole cover" was placed into the apex of the acetabular shell and tightened securely. The permanent E-polyethylene hi-wall liner was impacted into the acetabular shell and its locking mechanism verified using a quarter-inch osteotome. Next, the #12 laterally offset femoral stem was impacted into place with care taken to maintain the appropriate version. A repeat trial reduction was performed using the -3 mm neck length. The -3 mm neck length demonstrated excellent stability both in extension and external rotation as well as with flexion to 90 and internal rotation beyond 70. It also was stable in the position of sleep. In addition, leg lengths appeared to be restored appropriately, both by reassessing the position of the right leg over the left, as well as by measuring the distance between the Steinmann pin and the drill bit. The 36 mm ceramic head with the -3 mm neck was impacted onto the stem of the femoral component. The Morse taper locking mechanism was verified using manual distraction before the head was relocated and placed through a range of motion with the findings as described above.  The wound was copiously irrigated with sterile saline solution via the jet lavage system before the peri-incisional and pericapsular tissues were injected with a "cocktail" of 20 cc of Exparel, 30 cc of 0.5% Sensorcaine, 2 cc of Kenalog 40 (80 mg), and 30 mg of Toradol diluted out to 90 cc with normal saline to help with postoperative analgesia. The posterior flap was reapproximated to the posterior aspect of the greater trochanter using #2 Tycron interrupted sutures placed through drill holes. Several additional #2 Tycron interrupted sutures were used to  reinforce this layer of closure. The iliotibial band was reapproximated using #1 Vicryl interrupted sutures before the gluteal fascia was closed using a running #1 Vicryl suture. At this point, 1 g of transexemic acid in 10 cc of normal saline was injected into the joint to help reduce postoperative bleeding. The subcutaneous tissues were closed in several layers using 2-0 Vicryl interrupted sutures before the skin was closed using staples. A sterile occlusive dressing was applied to the wound. The patient was then rolled back into the supine position on her hospital bed before being awakened and returned to the recovery room in satisfactory condition after tolerating the procedure well.

## 2022-12-28 NOTE — Progress Notes (Signed)
PT Cancellation Note  Patient Details Name: Tara Olson MRN: [id number] DOB: May 06, 1951   Cancelled Treatment:    Reason Eval/Treat Not Completed: Other (comment). Pt still with limited motor function return and decreased sensation, not medically ready to initiate PT. Pt provided with HEP and RN notified of pt status as well as request to mobilize pt to recliner later today.  Olga Coaster PT, DPT 3:14 PM,12/28/22

## 2022-12-28 NOTE — H&P (Signed)
History of Present Illness:  Tara Olson is a 72 y.o. female who presents for a history and physical for an upcoming right total hip arthroplasty to be done by Dr. Joice Lofts on December 28, 2022. The patient saw Dr. Joice Lofts on Nov 10, 2022 for a follow-up of her right hip pain secondary to degenerative joint disease. She has undergone a course of physical therapy and has received at least one intra-articular steroid injection for her symptoms. She notes that the injection provided temporary partial relief, but the physical therapy seem to have aggravated her symptoms so she discontinued these on her own. She notes that her symptoms have been present for many years but have been gradually worsening. She rates her pain at 5/10 on today's visit. She has been taking ibuprofen and applying ice with limited benefit. Her symptoms are localized to the anterior lateral aspects of the right hip and are worse with any standing or or ambulation. She is ambulating without assistive devices, but frequently finds herself limping, especially after any prolonged distances. She also has difficulty ascending/descending stairs. She is sleeping well at night. She denies any specific injury to the hip, and denies any numbness or paresthesias down her leg to her foot. She also denies any bowel or bladder complaints.  Current Outpatient Medications:  acetaminophen (TYLENOL) 500 MG tablet Take by mouth  albuterol 90 mcg/actuation inhaler Inhale into the lungs every 4 (four) hours as needed  aspirin 81 MG EC tablet Take 81 mg by mouth once daily  BIOTIN ORAL Take by mouth  calcium carbonate-vitamin D3 (OS-CAL 500+D) 500 mg-10 mcg (400 unit) tablet Take 1 tablet by mouth 2 (two) times daily with meals  cyanocobalamin (VITAMIN B12) 1000 MCG tablet Take 1,000 mcg by mouth once daily  Herbal Supplement garlic  loratadine (CLARITIN) 10 mg tablet Take 10 mg by mouth once daily  meloxicam (MOBIC) 7.5 MG tablet Take 1 tablet (7.5 mg total) by  mouth 2 (two) times daily 60 tablet 2  multivitamin tablet Take 1 tablet by mouth once daily  omega-3s/dha/epa/fish oil (OMEGA 3 ORAL) Take by mouth   Allergies:  Penicillins Anaphylaxis  Hydrocodone-Acetaminophen Other (Psychiatric)   Past Medical History:  Closed fracture dislocation of right ankle with routine healing 07/20/2020  Environmental allergies 05/19/2015  Former smoker 07/17/2020  Hearing loss 07/17/2020  Osteoarthritis  Polyp of colon 07/17/2020  PVC's (premature ventricular contractions) 10/31/2020  Sleep apnea  Snoring 07/17/2020   Past Surgical History:  Laproscopy 07/05/1981  BREAST BIOPSY, NEEDLE CORE Right 2014 (Bx done in [place] , negative)  COLONOSCOPY WITH POLYPECTOMY 08/05/2015 (Tubular adenoma polyp removed. Next colonoscopy advised in 5 yrs)  US BREAST RIGHT BIOPSY Right 08/04/2020  US BREAST RIGHT BIOPSY AFF STO ULTRASOUND  Limited arthroscopic debridement, arthroscopic subacromial decompression, mini-open rotator cuff repair, and mini-open biceps tenodesis, right shoulder Right 12/22/2021 (Dr. Joice Lofts)  ENDOSCOPY, COLON, DIAGNOSTIC  TONSILLECTOMY  TONSILLECTOMY & ADENOIDECTOMY   Family History:  Colon cancer Mother  High blood pressure (Hypertension) Father  Diabetes type II Father  Coronary Artery Disease (Blocked arteries around heart) Father  Prostate cancer Brother  Coronary Artery Disease (Blocked arteries around heart) Brother  Sleep apnea Brother  Breast cancer Maternal Aunt  Breast cancer Paternal Aunt   Social History:   Socioeconomic History:  Marital status: Single  Tobacco Use  Smoking status: Former  Current packs/day: 0.00  Types: Cigarettes  Quit date: 04/06/2002  Years since quitting: 20.7  Smokeless tobacco: Never  Vaping Use  Vaping status: Never  Used   Review of Systems:  A comprehensive 14 point ROS was performed, reviewed, and the pertinent orthopaedic findings are documented in the HPI.  Physical Exam: Vitals:   12/20/22 1036  Weight: 80.3 kg (177 lb)  Height: 162.6 cm (5\' 4" )  PainSc: 4  PainLoc: Hip   General/Constitutional: The patient appears to be well-nourished, well-developed, and in no acute distress. Neuro/Psych: Normal mood and affect, oriented to person, place and time. Eyes: Non-icteric. Pupils are equal, round, and reactive to light, and exhibit synchronous movement. ENT: Unremarkable. Lymphatic: No palpable adenopathy. Respiratory: Lungs clear to auscultation, Normal chest excursion, No wheezes, and Non-labored breathing Cardiovascular: Regular rate and rhythm. No murmurs. and No edema, swelling or tenderness, except as noted in detailed exam. Integumentary: No impressive skin lesions present, except as noted in detailed exam. Musculoskeletal: Unremarkable, except as noted in detailed exam.  Heart: Examination of the heart reveals regular, rate, and rhythm. There is no murmur noted on ascultation. There is a normal apical pulse.  Lungs: Lungs are clear to auscultation. There is no wheeze, rhonchi, or crackles. There is normal expansion of bilateral chest walls.   Lumbar exam: Skin inspection of the lower back is unremarkable. In stance, her spine is straight and her pelvis is level. She can arise from a seated position without difficulty, and demonstrates at most a minimal limp, favoring her right leg, but is not using any assistive devices. She has no tenderness along the thoracic or lumbar spine, nor across the sacrum. She has no pain over either trochanteric region, either sciatic notch region, or either SI joint region. She can forward flex to 100 degrees and extend to 10 degrees with no discomfort in both flexion and extension. She can heel raise and toe raise without difficulty or weakness.  Right hip exam: Skin inspection of the right hip is unremarkable. No swelling, erythema, ecchymosis, abrasions, or other skin abnormalities are identified. She has moderate pain with right  hip internal rotation and external rotation. She exhibits flexion to 100 degrees, internal rotation to 15 degrees, and external rotation to 35 degrees on the right and flexion to 110 degrees, internal rotation to 25 degrees, and external rotation to 50 degrees on the left. She is neurovascularly intact to both lower extremities. She has negative sitting straight leg raises bilaterally.  X-rays/MRI/Lab data:  X-rays of the pelvis and right hip are obtained. These films demonstrate evidence of moderate to severe degenerative joint disease as manifest by symmetric joint space narrowing, early osteophyte formation, and subchondral cystic changes. No lytic lesions or fractures are identified.  Assessment: 1. Primary osteoarthritis of right hip.  2. Adult BMI 30.0-30.9 kg/sq m   Plan: The treatment options were discussed with the patient and her significant other. In addition, patient educational materials were provided regarding the diagnosis and treatment options. The patient is quite frustrated by her symptoms and functional limitations, and is ready to consider more aggressive treatment options. Therefore, I have recommended a surgical procedure, specifically a right total hip arthroplasty. The procedure was discussed with the patient, as were the potential risks (including bleeding, infection, nerve and/or blood vessel injury, persistent or recurrent pain, loosening and/or failure of the components, dislocation, leg length inequality, need for further surgery, blood clots, strokes, heart attacks and/or arhythmias, pneumonia, etc.) and benefits. The patient states his/her understanding and wishes to proceed. All of the patient's questions and concerns were answered. She can call any time with further concerns. She will follow up post-surgery, routine.  H&P reviewed and patient re-examined. No changes.

## 2022-12-29 ENCOUNTER — Encounter: Payer: Self-pay | Admitting: Surgery

## 2022-12-29 DIAGNOSIS — R269 Unspecified abnormalities of gait and mobility: Secondary | ICD-10-CM | POA: Diagnosis not present

## 2022-12-29 DIAGNOSIS — G4730 Sleep apnea, unspecified: Secondary | ICD-10-CM | POA: Diagnosis not present

## 2022-12-29 DIAGNOSIS — I493 Ventricular premature depolarization: Secondary | ICD-10-CM | POA: Diagnosis not present

## 2022-12-29 DIAGNOSIS — K219 Gastro-esophageal reflux disease without esophagitis: Secondary | ICD-10-CM | POA: Diagnosis not present

## 2022-12-29 DIAGNOSIS — F32A Depression, unspecified: Secondary | ICD-10-CM | POA: Diagnosis not present

## 2022-12-29 DIAGNOSIS — Z79899 Other long term (current) drug therapy: Secondary | ICD-10-CM | POA: Diagnosis not present

## 2022-12-29 DIAGNOSIS — Z87891 Personal history of nicotine dependence: Secondary | ICD-10-CM | POA: Diagnosis not present

## 2022-12-29 DIAGNOSIS — M1611 Unilateral primary osteoarthritis, right hip: Secondary | ICD-10-CM | POA: Diagnosis not present

## 2022-12-29 DIAGNOSIS — Z96641 Presence of right artificial hip joint: Secondary | ICD-10-CM | POA: Diagnosis not present

## 2022-12-29 LAB — CBC
HCT: 37.5 % (ref 36.0–46.0)
Hemoglobin: 12.2 g/dL (ref 12.0–15.0)
MCH: 30.5 pg (ref 26.0–34.0)
MCHC: 32.5 g/dL (ref 30.0–36.0)
MCV: 93.8 fL (ref 80.0–100.0)
Platelets: 214 10*3/uL (ref 150–400)
RBC: 4 MIL/uL (ref 3.87–5.11)
RDW: 12.4 % (ref 11.5–15.5)
WBC: 10.9 10*3/uL — ABNORMAL HIGH (ref 4.0–10.5)
nRBC: 0 % (ref 0.0–0.2)

## 2022-12-29 LAB — BASIC METABOLIC PANEL
Anion gap: 8 (ref 5–15)
BUN: 23 mg/dL (ref 8–23)
CO2: 24 mmol/L (ref 22–32)
Calcium: 8.4 mg/dL — ABNORMAL LOW (ref 8.9–10.3)
Chloride: 105 mmol/L (ref 98–111)
Creatinine, Ser: 0.79 mg/dL (ref 0.44–1.00)
GFR, Estimated: >60 mL/min (ref >60)
Glucose, Bld: 159 mg/dL — ABNORMAL HIGH (ref 70–99)
Potassium: 4.6 mmol/L (ref 3.5–5.1)
Sodium: 137 mmol/L (ref 135–145)

## 2022-12-29 MED ORDER — KETOROLAC TROMETHAMINE 15 MG/ML IJ SOLN
INTRAMUSCULAR | Status: AC
  Filled 2022-12-29: qty 1

## 2022-12-29 MED ORDER — APIXABAN 2.5 MG PO TABS: 2.5000 mg | ORAL_TABLET | Freq: Two times a day (BID) | ORAL | 0 refills | Status: DC

## 2022-12-29 MED ORDER — DOCUSATE SODIUM 100 MG PO CAPS
ORAL_CAPSULE | ORAL | Status: AC
  Filled 2022-12-29: qty 1

## 2022-12-29 MED ORDER — APIXABAN 2.5 MG PO TABS
ORAL_TABLET | ORAL | Status: AC
  Filled 2022-12-29: qty 1

## 2022-12-29 MED ORDER — OXYCODONE HCL 5 MG PO TABS: 5.0000 mg | ORAL_TABLET | ORAL | 0 refills | Status: DC | PRN

## 2022-12-29 MED ORDER — ACETAMINOPHEN 500 MG PO TABS: 1000.0000 mg | ORAL_TABLET | Freq: Four times a day (QID) | ORAL | 0 refills | Status: AC

## 2022-12-29 MED ORDER — ADULT MULTIVITAMIN W/MINERALS CH
ORAL_TABLET | ORAL | Status: AC
  Filled 2022-12-29: qty 1

## 2022-12-29 MED ORDER — ONDANSETRON HCL 4 MG PO TABS: 4.0000 mg | ORAL_TABLET | Freq: Four times a day (QID) | ORAL | 0 refills | Status: DC | PRN

## 2022-12-29 MED ORDER — CEFAZOLIN SODIUM-DEXTROSE 2-4 GM/100ML-% IV SOLN
INTRAVENOUS | Status: AC
  Filled 2022-12-29: qty 100

## 2022-12-29 MED ORDER — OXYCODONE HCL 5 MG PO TABS
ORAL_TABLET | ORAL | Status: AC
  Filled 2022-12-29: qty 1

## 2022-12-29 MED ORDER — ACETAMINOPHEN 500 MG PO TABS
ORAL_TABLET | ORAL | Status: AC
  Filled 2022-12-29: qty 2

## 2022-12-29 NOTE — Anesthesia Postprocedure Evaluation (Signed)
Anesthesia Post Note  Patient: Pharmacist, hospital  Procedure(s) Performed: TOTAL HIP ARTHROPLASTY (Right: Hip)  Patient location during evaluation: Nursing Unit Anesthesia Type: Spinal Level of consciousness: awake, awake and alert and oriented Pain management: pain level controlled Vital Signs Assessment: post-procedure vital signs reviewed and stable Respiratory status: spontaneous breathing, nonlabored ventilation and respiratory function stable Cardiovascular status: blood pressure returned to baseline Postop Assessment: no headache and no backache Anesthetic complications: no   No notable events documented.   Last Vitals:  Vitals:   12/29/22 0500 12/29/22 0725  BP: (!) 141/74 135/65  Pulse: 75 73  Resp: 16 16  Temp: 36.4 C 37.2 C  SpO2: 94% 95%    Last Pain:  Vitals:   12/29/22 0725  TempSrc: Temporal  PainSc:                  Tara Olson

## 2022-12-29 NOTE — Progress Notes (Signed)
Patient is not able to walk the distance required to go the bathroom, or he/she is unable to safely negotiate stairs required to access the bathroom.  A 3in1 BSC will alleviate this problem  

## 2022-12-29 NOTE — Evaluation (Addendum)
Physical Therapy Evaluation Patient Details Name: Tara Olson MRN: [id number] DOB: 10-14-1950 Today's Date: 12/29/2022  History of Present Illness  Pt is a 72 y/o F who presents s/p R total hip arthroplasty. PMH significant for PVC, HOH, depression, anxiety, and GERD.  Clinical Impression  Pt is in chair, denies any pain at rest. Pt able to recall 1/3 precautions at beginning of session. PTA pt was independent with mobility and ADL's, lives with spouse. Pt able to perform transfers and ambulation c/ CGA and RW. Pt ambulated 240ft c/ RW and CGA progressing to supervision c/ cueing needed to use safe turning technique. Pt able to complete 4 steps up/down c/ CGA and cueing for proper technique. Pt able to recall 2/3 precautions at the end of the session, PT educated on importance of abiding by precautions. Pt left in chair, needs within reach. Pt would benefit from skilled PT services to improve mobility and functional activity tolerance.      Recommendations for follow up therapy are one component of a multi-disciplinary discharge planning process, led by the attending physician.  Recommendations may be updated based on patient status, additional functional criteria and insurance authorization.  Follow Up Recommendations       Assistance Recommended at Discharge PRN  Patient can return home with the following  A little help with bathing/dressing/bathroom;A little help with walking and/or transfers;Assist for transportation;Assistance with cooking/housework;Help with stairs or ramp for entrance    Equipment Recommendations Rolling walker (2 wheels)  Recommendations for Other Services       Functional Status Assessment Patient has had a recent decline in their functional status and demonstrates the ability to make significant improvements in function in a reasonable and predictable amount of time.     Precautions / Restrictions Precautions Precautions: Fall;Posterior Hip Precaution  Booklet Issued: Yes (comment) Precaution Comments: posterior hip precautions Restrictions Weight Bearing Restrictions: Yes RLE Weight Bearing: Weight bearing as tolerated      Mobility  Bed Mobility                    Transfers Overall transfer level: Needs assistance Equipment used: Rolling walker (2 wheels) Transfers: Sit to/from Stand Sit to Stand: Min guard                Ambulation/Gait Ambulation/Gait assistance: Min guard, Supervision Gait Distance (Feet): 200 Feet Assistive device: Rolling walker (2 wheels) Gait Pattern/deviations: Step-through pattern       General Gait Details: increased gait speed, cued for proper AD navigation c/ turning  Stairs Stairs: Yes Stairs assistance: Min guard Stair Management: One rail Right Number of Stairs: 4    Wheelchair Mobility    Modified Rankin (Stroke Patients Only)       Balance Overall balance assessment: Needs assistance Sitting-balance support: Feet supported Sitting balance-Leahy Scale: Good     Standing balance support: Bilateral upper extremity supported Standing balance-Leahy Scale: Good                               Pertinent Vitals/Pain Pain Assessment Pain Assessment: No/denies pain    Home Living Family/patient expects to be discharged to:: Private residence Living Arrangements: Spouse/significant other Available Help at Discharge: Available 24 hours/day;Family Type of Home: House Home Access: Stairs to enter Entrance Stairs-Rails: Right Entrance Stairs-Number of Steps: 3 Alternate Level Stairs-Number of Steps: 8 Home Layout: Two level Home Equipment: Grab bars - tub/shower      Prior  Function Prior Level of Function : Independent/Modified Independent             Mobility Comments: independent with no AD ADLs Comments: able to take of self at baseline     Hand Dominance        Extremity/Trunk Assessment   Upper Extremity Assessment Upper  Extremity Assessment: Overall WFL for tasks assessed    Lower Extremity Assessment Lower Extremity Assessment: RLE deficits/detail RLE Deficits / Details: s/p R total hip arthroplasty RLE Sensation: WNL       Communication   Communication: No difficulties  Cognition Arousal/Alertness: Awake/alert Behavior During Therapy: WFL for tasks assessed/performed Overall Cognitive Status: Within Functional Limits for tasks assessed                                          General Comments      Exercises Total Joint Exercises Long Arc Quad: Seated, Both, 10 reps, AROM   Assessment/Plan    PT Assessment Patient needs continued PT services  PT Problem List Decreased strength;Decreased cognition;Decreased range of motion;Decreased knowledge of use of DME;Decreased activity tolerance;Decreased safety awareness;Decreased balance;Pain;Decreased mobility;Decreased coordination       PT Treatment Interventions DME instruction;Balance training;Gait training;Neuromuscular re-education;Stair training;Functional mobility training;Patient/family education;Therapeutic activities;Therapeutic exercise    PT Goals (Current goals can be found in the Care Plan section)  Acute Rehab PT Goals Patient Stated Goal: to return to home PT Goal Formulation: With patient Time For Goal Achievement: 01/12/23 Potential to Achieve Goals: Good    Frequency 7X/week     Co-evaluation               AM-PAC PT "6 Clicks" Mobility  Outcome Measure Help needed turning from your back to your side while in a flat bed without using bedrails?: None Help needed moving from lying on your back to sitting on the side of a flat bed without using bedrails?: None Help needed moving to and from a bed to a chair (including a wheelchair)?: None Help needed standing up from a chair using your arms (e.g., wheelchair or bedside chair)?: None Help needed to walk in hospital room?: None Help needed climbing  3-5 steps with a railing? : A Little 6 Click Score: 23    End of Session Equipment Utilized During Treatment: Gait belt Activity Tolerance: Patient tolerated treatment well;No increased pain Patient left: in chair;with call bell/phone within reach Nurse Communication: Mobility status PT Visit Diagnosis: Other abnormalities of gait and mobility (R26.89);Muscle weakness (generalized) (M62.81);Pain Pain - Right/Left: Right Pain - part of body: Hip    Time: 4010-2725 PT Time Calculation (min) (ACUTE ONLY): 16 min   Charges:            This patient treatment was supervised by this clinician.  The note, response to treatment and overall treatment plan has been reviewed and this clinician agrees with the information provided.  Cephus Slater, PT, DPT, NCS 12/29/22, 12:53 PM  Lala Lund, PT, SPT  9:53 AM,12/29/22

## 2022-12-29 NOTE — Discharge Summary (Signed)
Physician Discharge Summary  Patient ID: Tara Olson MRN: [id number] DOB/AGE: [birth year] 72 y.o.  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  Discharge Diagnoses: Patient Active Problem List   Diagnosis Date Noted   Status post total hip replacement, right 12/28/2022   Complex sclerosing lesion of right breast 06/04/2022   Primary osteoarthritis of right hip 03/31/2022   Tendinitis of upper biceps tendon of right shoulder 12/23/2021   Nontraumatic complete tear of right rotator cuff 10/28/2021   Rotator cuff tendinitis, right 10/28/2021   PVC's (premature ventricular contractions) 10/31/2020   Fibrocystic breast 08/06/2020   Closed fracture dislocation of right ankle with routine healing 07/20/2020   Former smoker 07/17/2020   Hearing loss 07/17/2020   Polyp of colon 07/17/2020   Snoring 07/17/2020   Screen for colon cancer 08/05/2015   Environmental allergies 05/19/2015    Past Medical History:  Diagnosis Date   Arthritis    Depression    Dyspnea    GERD (gastroesophageal reflux disease)    Sleep apnea      Transfusion: None.   Consultants (if any):   Discharged Condition: Improved  Hospital Course: Tara Olson is an 72 y.o. female who was admitted 12/28/2022 with a diagnosis of degenerative joint disease of the right hip and went to the operating room on 12/28/2022 and underwent the above named procedures.    Surgeries: Procedure(s): TOTAL HIP ARTHROPLASTY on 12/28/2022 Patient tolerated the surgery well. Taken to PACU where she was stabilized and then transferred to the recovery area.  Started on Eliquis 2.5mg  q 12 hrs. Heels elevated on bed with rolled towels. No evidence of DVT. Negative Homan. Physical therapy started on day #1 for gait training and transfer. OT started day #1 for ADL and assisted devices.  Patient's IV was  removed on POD1.  Implants: Biomet press-fit system with a #12 laterally offset Echo femoral stem, a 50 mm acetabular shell with an E-poly hi-wall liner, and a 36 mm ceramic head with a -3 mm neck.    She was given perioperative antibiotics:  Anti-infectives (From admission, onward)    Start     Dose/Rate Route Frequency Ordered Stop   12/28/22 1700  ceFAZolin (ANCEF) IVPB 2g/100 mL premix        2 g 200 mL/hr over 30 Minutes Intravenous Every 6 hours 12/28/22 1440 12/29/22 0545   12/28/22 1045  ceFAZolin (ANCEF) IVPB 2g/100 mL premix        2 g 200 mL/hr over 30 Minutes Intravenous  Once 12/28/22 1041 12/28/22 1139     .  She was given sequential compression devices, early ambulation, and Eliquis for DVT prophylaxis.  She benefited maximally from the hospital stay and there were no complications.    Recent vital signs:  Vitals:   12/29/22 0500 12/29/22 0725  BP: (!) 141/74 135/65  Pulse: 75 73  Resp: 16 16  Temp: 97.6 F (36.4 C) 99 F (37.2 C)  SpO2: 94% 95%    Recent laboratory studies:  Lab Results  Component Value Date   HGB 12.2 12/29/2022   HGB 14.0 12/20/2022   HGB 13.8 12/16/2021   Lab Results  Component Value Date   WBC 10.9 (H) 12/29/2022   PLT 214 12/29/2022   No results found for: "INR" Lab Results  Component Value Date   NA 137 12/29/2022   K 4.6 12/29/2022   CL 105 12/29/2022  CO2 24 12/29/2022   BUN 23 12/29/2022   CREATININE 0.79 12/29/2022   GLUCOSE 159 (H) 12/29/2022    Discharge Medications:   Allergies as of 12/29/2022       Reactions   Hydrocodone Other (See Comments)   Penicillins Swelling   Mouth swelling         Medication List     STOP taking these medications    Advil 200 MG Caps Generic drug: Ibuprofen   aspirin EC 81 MG tablet   meloxicam 7.5 MG tablet Commonly known as: MOBIC       TAKE these medications    acetaminophen 325 MG tablet Commonly known as: TYLENOL Take 650 mg by mouth every 6 (six)  hours as needed. What changed: Another medication with the same name was added. Make sure you understand how and when to take each.   acetaminophen 500 MG tablet Commonly known as: TYLENOL Take 2 tablets (1,000 mg total) by mouth every 6 (six) hours. What changed: You were already taking a medication with the same name, and this prescription was added. Make sure you understand how and when to take each.   albuterol 108 (90 Base) MCG/ACT inhaler Commonly known as: VENTOLIN HFA Inhale 2 puffs into the lungs every 4 (four) hours as needed.   apixaban 2.5 MG Tabs tablet Commonly known as: ELIQUIS Take 1 tablet (2.5 mg total) by mouth 2 (two) times daily.   Biotin 5000 MCG Caps Take by mouth.   CALCIUM 1200 PO Take by mouth.   escitalopram 10 MG tablet Commonly known as: LEXAPRO TAKE 1 TABLET(10 MG) BY MOUTH DAILY   Fish Oil 1000 MG Caps Take 1 capsule by mouth daily.   Garlic 10 MG Caps garlic   loratadine 10 MG tablet Commonly known as: CLARITIN Take 10 mg by mouth daily.   multivitamin capsule Take 1 capsule by mouth daily.   ondansetron 4 MG tablet Commonly known as: ZOFRAN Take 1 tablet (4 mg total) by mouth every 6 (six) hours as needed for nausea.   oxyCODONE 5 MG immediate release tablet Commonly known as: Oxy IR/ROXICODONE Take 1-2 tablets (5-10 mg total) by mouth every 4 (four) hours as needed for moderate pain (pain score 4-6).   VITAMIN B 12 PO Take by mouth.               Durable Medical Equipment  (From admission, onward)           Start     Ordered   12/28/22 1441  DME Bedside commode  Once       Question:  Patient needs a bedside commode to treat with the following condition  Answer:  Status post total hip replacement, right   12/28/22 1440   12/28/22 1441  DME 3 n 1  Once        12/28/22 1440   12/28/22 1441  DME Walker rolling  Once       Question Answer Comment  Walker: With 5 Inch Wheels   Patient needs a walker to treat with  the following condition Status post total hip replacement, right      12/28/22 1440            Diagnostic Studies: DG HIP UNILAT W OR W/O PELVIS 2-3 VIEWS RIGHT  Result Date: 12/28/2022 CLINICAL DATA:  Status post hip replacement. EXAM: DG HIP (WITH OR WITHOUT PELVIS) 2-3V RIGHT COMPARISON:  None Available. FINDINGS: Right hip arthroplasty in expected alignment. No periprosthetic lucency  or fracture. Recent postsurgical change includes air and edema in the soft tissues. Lateral skin staples in place. IMPRESSION: Right hip arthroplasty without immediate postoperative complication. Electronically Signed   By: Narda Rutherford M.D.   On: 12/28/2022 14:17   DG Chest 2 View  Result Date: 12/20/2022 CLINICAL DATA:  Shortness of breath EXAM: CHEST - 2 VIEW COMPARISON:  None Available. FINDINGS: The heart size and mediastinal contours are within normal limits. Both lungs are clear. The visualized skeletal structures are unremarkable. IMPRESSION: No active cardiopulmonary disease. Electronically Signed   By: Darliss Cheney M.D.   On: 12/20/2022 15:34    Disposition: Plan for discharge home today pending progress with PT.   Follow-up Information     Anson Oregon, PA-C Follow up in 14 day(s).   Specialty: Physician Assistant Why: Mindi Slicker information: [address] [place] [address] [phone]                Signed: Meriel Pica PA-C 12/29/2022, 7:41 AM

## 2022-12-29 NOTE — Progress Notes (Signed)
DISCHARGE NOTE:  Pt given discharge instructions and verbalized understanding. TED hose on both legs. Walker and [facility] sent with pt. Pt wheeled to car by staff. Family providing transportation.

## 2022-12-29 NOTE — Discharge Instructions (Signed)
Instructions after Total Hip Replacement     J. Derald Macleod, M.D.  Valeria Batman, PA-C     Dept. of Orthopaedics & Sports Medicine  [facility]  [address]  [place], [place]  [address]  Phone: [phone]   Fax: [phone]    DIET: Drink plenty of non-alcoholic fluids. Resume your normal diet. Include foods high in fiber.  ACTIVITY:  You may use crutches or a walker with weight-bearing as tolerated, unless instructed otherwise. You may be weaned off of the walker or crutches by your Physical Therapist.  Do NOT reach below the level of your knees or cross your legs until allowed.    Continue doing gentle exercises. Exercising will reduce the pain and swelling, increase motion, and prevent muscle weakness.   Please continue to use the TED compression stockings for 6 weeks. You may remove the stockings at night, but should reapply them in the morning. Do not drive or operate any equipment until instructed.  WOUND CARE:  Continue to use ice packs periodically to reduce pain and swelling. Keep the incision clean and dry. You may bathe or shower after the staples are removed at the first office visit following surgery.  MEDICATIONS: You may resume your regular medications. Please take the pain medication as prescribed on the medication. Do not take pain medication on an empty stomach. You have been given a prescription for a blood thinner to prevent blood clots. Please take the medication as instructed. (NOTE: After completing a 2 week course of Eliquis, take one Enteric-coated aspirin twice a day.) Pain medications and iron supplements can cause constipation. Use a stool softener (Senokot or Colace) on a daily basis and a laxative (dulcolax or miralax) as needed. Do not drive or drink alcoholic beverages when taking pain medications.  CALL THE OFFICE FOR: Temperature above 101 degrees Excessive bleeding or drainage on the dressing. Excessive swelling, coldness, or  paleness of the toes. Persistent nausea and vomiting.  FOLLOW-UP:  You should have an appointment to return to the office in 2 weeks after surgery. Arrangements have been made for continuation of Physical Therapy (either home therapy or outpatient therapy).

## 2022-12-29 NOTE — TOC Transition Note (Signed)
Transition of Care [facility]) - CM/SW Discharge Note   Patient Details  Name: Tara Olson MRN: [id number] Date of Birth: [birth year]  Transition of Care [facility]) CM/SW Contact:  Garret Reddish, RN Phone Number: 12/29/2022, 11:03 AM   Clinical Narrative:   Chart reviewed.  Noted that patient has orders for discharge today.    Patient has been pre-arranged with [facility] for home health.  [facility] will provide home health PT and OT at home. Mrs. Herskowitz would like to use their services on discharge.    Mrs. Larsen reports that she will need a 2 Wheeled Rolling walker and BSC for home use.    I have asked John with [employer] to deliver a BSC and 2 wheeled rolling walker to bedside today prior to discharge.    I have informed staff nurse of the above information.      Final next level of care: Home w Home Health Services Barriers to Discharge: No Barriers Identified   Patient Goals and CMS Choice CMS Medicare.gov Compare Post Acute Care list provided to:: Patient Choice offered to / list presented to : Patient  Discharge Placement                      Patient and family notified of of transfer: 12/29/22  Discharge Plan and Services Additional resources added to the After Visit Summary for                  DME Arranged: 3-N-1, Walker rolling DME Agency: [employer] Date DME Agency Contacted: 12/29/22 Time DME Agency Contacted: 0930 Representative spoke with at DME Agency: Jonny Ruiz HH Arranged: PT, OT HH Agency: [facility] Date [facility] Agency Contacted: 12/29/22 Time HH Agency Contacted: 1000 Representative spoke with at [facility] Agency: [place]  Social Determinants of Health (SDOH) Interventions SDOH Screenings   Food Insecurity: No Food Insecurity (12/28/2022)  Housing: Low Risk  (12/28/2022)  Transportation Needs: No Transportation Needs (12/28/2022)  Utilities: Not At Risk (12/28/2022)  Alcohol Screen: Low Risk  (07/25/2022)  Depression  (PHQ2-9): Low Risk  (03/30/2022)  Recent Concern: Depression (PHQ2-9) - Medium Risk (01/21/2022)  Tobacco Use: Medium Risk (12/28/2022)     Readmission Risk Interventions     No data to display

## 2022-12-29 NOTE — Progress Notes (Signed)
  Subjective: 1 Day Post-Op Procedure(s) (LRB): TOTAL HIP ARTHROPLASTY (Right) Patient reports pain as mild.   Patient is well, and has had no acute complaints or problems Plan is to go Home after hospital stay. Negative for chest pain and shortness of breath Fever: no Gastrointestinal:Negative for nausea and vomiting Reports she is passing gas, urinating this morning without issue.  Objective: Vital signs in last 24 hours: Temp:  [97 F (36.1 C)-99 F (37.2 C)] 99 F (37.2 C) (06/26 0725) Pulse Rate:  [59-98] 73 (06/26 0725) Resp:  [12-22] 16 (06/26 0725) BP: (107-166)/(51-94) 135/65 (06/26 0725) SpO2:  [93 %-96 %] 95 % (06/26 0725) Weight:  [79.4 kg] 79.4 kg (06/25 0828)  Intake/Output from previous day:  Intake/Output Summary (Last 24 hours) at 12/29/2022 0736 Last data filed at 12/29/2022 0300 Gross per 24 hour  Intake 1717.05 ml  Output 1150 ml  Net 567.05 ml    Intake/Output this shift: No intake/output data recorded.  Labs: Recent Labs    12/29/22 0602  HGB 12.2   Recent Labs    12/29/22 0602  WBC 10.9*  RBC 4.00  HCT 37.5  PLT 214   Recent Labs    12/29/22 0602  NA 137  K 4.6  CL 105  CO2 24  BUN 23  CREATININE 0.79  GLUCOSE 159*  CALCIUM 8.4*   No results for input(s): "LABPT", "INR" in the last 72 hours.   EXAM General - Patient is Alert, Appropriate, and Oriented Extremity - ABD soft Sensation intact distally Intact pulses distally Dorsiflexion/Plantar flexion intact Incision: dressing C/D/I No cellulitis present Compartment soft Dressing/Incision - clean, dry, no drainage to the right hip honeycomb. Motor Function - intact, moving foot and toes well on exam.  Abdomen soft with intact bowel sounds this morning.  Past Medical History:  Diagnosis Date   Arthritis    Depression    Dyspnea    GERD (gastroesophageal reflux disease)    Sleep apnea     Assessment/Plan: 1 Day Post-Op Procedure(s) (LRB): TOTAL HIP ARTHROPLASTY  (Right) Principal Problem:   Status post total hip replacement, right  Estimated body mass index is 30.04 kg/m as calculated from the following:   Height as of this encounter: 5\' 4"  (1.626 m).   Weight as of this encounter: 79.4 kg. Advance diet Up with therapy D/C IV fluids when tolerating po intake.  Labs and vitals reviewed this AM. Weakness from spinal has resolved, intact to light touch.  Dorsiflexion and plantarflexion intact. Patient is passing gas without pain Up with therapy today, does have 3 steps to enter house. Plan for discharge home today pending progress with PT.  DVT Prophylaxis - TED hose and Eliquis Weight-Bearing as tolerated to right leg  J. Horris Latino, PA-C [facility] Orthopaedic Surgery 12/29/2022, 7:36 AM

## 2022-12-29 NOTE — Plan of Care (Signed)
  Problem: Activity: Goal: Risk for activity intolerance will decrease Outcome: Progressing   Problem: Activity: Goal: Ability to avoid complications of mobility impairment will improve Outcome: Progressing

## 2022-12-30 DIAGNOSIS — G4730 Sleep apnea, unspecified: Secondary | ICD-10-CM | POA: Diagnosis not present

## 2022-12-30 DIAGNOSIS — Z471 Aftercare following joint replacement surgery: Secondary | ICD-10-CM | POA: Diagnosis not present

## 2022-12-30 DIAGNOSIS — Z87891 Personal history of nicotine dependence: Secondary | ICD-10-CM | POA: Diagnosis not present

## 2022-12-30 DIAGNOSIS — Z7901 Long term (current) use of anticoagulants: Secondary | ICD-10-CM | POA: Diagnosis not present

## 2022-12-30 DIAGNOSIS — K219 Gastro-esophageal reflux disease without esophagitis: Secondary | ICD-10-CM | POA: Diagnosis not present

## 2022-12-30 DIAGNOSIS — F32A Depression, unspecified: Secondary | ICD-10-CM | POA: Diagnosis not present

## 2022-12-30 DIAGNOSIS — Z9181 History of falling: Secondary | ICD-10-CM | POA: Diagnosis not present

## 2022-12-30 DIAGNOSIS — Z86010 Personal history of colonic polyps: Secondary | ICD-10-CM | POA: Diagnosis not present

## 2022-12-30 DIAGNOSIS — H9190 Unspecified hearing loss, unspecified ear: Secondary | ICD-10-CM | POA: Diagnosis not present

## 2022-12-30 DIAGNOSIS — Z96641 Presence of right artificial hip joint: Secondary | ICD-10-CM | POA: Diagnosis not present

## 2023-01-01 DIAGNOSIS — Z86010 Personal history of colonic polyps: Secondary | ICD-10-CM | POA: Diagnosis not present

## 2023-01-01 DIAGNOSIS — H9190 Unspecified hearing loss, unspecified ear: Secondary | ICD-10-CM | POA: Diagnosis not present

## 2023-01-01 DIAGNOSIS — Z7901 Long term (current) use of anticoagulants: Secondary | ICD-10-CM | POA: Diagnosis not present

## 2023-01-01 DIAGNOSIS — Z87891 Personal history of nicotine dependence: Secondary | ICD-10-CM | POA: Diagnosis not present

## 2023-01-01 DIAGNOSIS — Z96641 Presence of right artificial hip joint: Secondary | ICD-10-CM | POA: Diagnosis not present

## 2023-01-01 DIAGNOSIS — F32A Depression, unspecified: Secondary | ICD-10-CM | POA: Diagnosis not present

## 2023-01-01 DIAGNOSIS — K219 Gastro-esophageal reflux disease without esophagitis: Secondary | ICD-10-CM | POA: Diagnosis not present

## 2023-01-01 DIAGNOSIS — Z9181 History of falling: Secondary | ICD-10-CM | POA: Diagnosis not present

## 2023-01-01 DIAGNOSIS — G4730 Sleep apnea, unspecified: Secondary | ICD-10-CM | POA: Diagnosis not present

## 2023-01-01 DIAGNOSIS — Z471 Aftercare following joint replacement surgery: Secondary | ICD-10-CM | POA: Diagnosis not present

## 2023-01-03 DIAGNOSIS — K219 Gastro-esophageal reflux disease without esophagitis: Secondary | ICD-10-CM | POA: Diagnosis not present

## 2023-01-03 DIAGNOSIS — Z86010 Personal history of colonic polyps: Secondary | ICD-10-CM | POA: Diagnosis not present

## 2023-01-03 DIAGNOSIS — G4730 Sleep apnea, unspecified: Secondary | ICD-10-CM | POA: Diagnosis not present

## 2023-01-03 DIAGNOSIS — H9190 Unspecified hearing loss, unspecified ear: Secondary | ICD-10-CM | POA: Diagnosis not present

## 2023-01-03 DIAGNOSIS — Z9181 History of falling: Secondary | ICD-10-CM | POA: Diagnosis not present

## 2023-01-03 DIAGNOSIS — F32A Depression, unspecified: Secondary | ICD-10-CM | POA: Diagnosis not present

## 2023-01-03 DIAGNOSIS — Z87891 Personal history of nicotine dependence: Secondary | ICD-10-CM | POA: Diagnosis not present

## 2023-01-03 DIAGNOSIS — Z7901 Long term (current) use of anticoagulants: Secondary | ICD-10-CM | POA: Diagnosis not present

## 2023-01-03 DIAGNOSIS — Z96641 Presence of right artificial hip joint: Secondary | ICD-10-CM | POA: Diagnosis not present

## 2023-01-03 DIAGNOSIS — Z471 Aftercare following joint replacement surgery: Secondary | ICD-10-CM | POA: Diagnosis not present

## 2023-01-05 DIAGNOSIS — H9190 Unspecified hearing loss, unspecified ear: Secondary | ICD-10-CM | POA: Diagnosis not present

## 2023-01-05 DIAGNOSIS — F32A Depression, unspecified: Secondary | ICD-10-CM | POA: Diagnosis not present

## 2023-01-05 DIAGNOSIS — K219 Gastro-esophageal reflux disease without esophagitis: Secondary | ICD-10-CM | POA: Diagnosis not present

## 2023-01-05 DIAGNOSIS — G4730 Sleep apnea, unspecified: Secondary | ICD-10-CM | POA: Diagnosis not present

## 2023-01-05 DIAGNOSIS — Z87891 Personal history of nicotine dependence: Secondary | ICD-10-CM | POA: Diagnosis not present

## 2023-01-05 DIAGNOSIS — Z86010 Personal history of colonic polyps: Secondary | ICD-10-CM | POA: Diagnosis not present

## 2023-01-05 DIAGNOSIS — Z9181 History of falling: Secondary | ICD-10-CM | POA: Diagnosis not present

## 2023-01-05 DIAGNOSIS — Z96641 Presence of right artificial hip joint: Secondary | ICD-10-CM | POA: Diagnosis not present

## 2023-01-05 DIAGNOSIS — Z471 Aftercare following joint replacement surgery: Secondary | ICD-10-CM | POA: Diagnosis not present

## 2023-01-05 DIAGNOSIS — Z7901 Long term (current) use of anticoagulants: Secondary | ICD-10-CM | POA: Diagnosis not present

## 2023-01-07 DIAGNOSIS — Z7901 Long term (current) use of anticoagulants: Secondary | ICD-10-CM | POA: Diagnosis not present

## 2023-01-07 DIAGNOSIS — G4730 Sleep apnea, unspecified: Secondary | ICD-10-CM | POA: Diagnosis not present

## 2023-01-07 DIAGNOSIS — F32A Depression, unspecified: Secondary | ICD-10-CM | POA: Diagnosis not present

## 2023-01-07 DIAGNOSIS — Z96641 Presence of right artificial hip joint: Secondary | ICD-10-CM | POA: Diagnosis not present

## 2023-01-07 DIAGNOSIS — Z9181 History of falling: Secondary | ICD-10-CM | POA: Diagnosis not present

## 2023-01-07 DIAGNOSIS — Z471 Aftercare following joint replacement surgery: Secondary | ICD-10-CM | POA: Diagnosis not present

## 2023-01-07 DIAGNOSIS — Z86010 Personal history of colonic polyps: Secondary | ICD-10-CM | POA: Diagnosis not present

## 2023-01-07 DIAGNOSIS — K219 Gastro-esophageal reflux disease without esophagitis: Secondary | ICD-10-CM | POA: Diagnosis not present

## 2023-01-07 DIAGNOSIS — H9190 Unspecified hearing loss, unspecified ear: Secondary | ICD-10-CM | POA: Diagnosis not present

## 2023-01-07 DIAGNOSIS — Z87891 Personal history of nicotine dependence: Secondary | ICD-10-CM | POA: Diagnosis not present

## 2023-01-09 ENCOUNTER — Encounter: Payer: Self-pay | Admitting: Family Medicine

## 2023-01-10 ENCOUNTER — Encounter: Payer: Self-pay | Admitting: Family Medicine

## 2023-01-10 DIAGNOSIS — Z7901 Long term (current) use of anticoagulants: Secondary | ICD-10-CM | POA: Diagnosis not present

## 2023-01-10 DIAGNOSIS — G4730 Sleep apnea, unspecified: Secondary | ICD-10-CM | POA: Diagnosis not present

## 2023-01-10 DIAGNOSIS — Z9181 History of falling: Secondary | ICD-10-CM | POA: Diagnosis not present

## 2023-01-10 DIAGNOSIS — Z96641 Presence of right artificial hip joint: Secondary | ICD-10-CM | POA: Diagnosis not present

## 2023-01-10 DIAGNOSIS — H9190 Unspecified hearing loss, unspecified ear: Secondary | ICD-10-CM | POA: Diagnosis not present

## 2023-01-10 DIAGNOSIS — Z86010 Personal history of colonic polyps: Secondary | ICD-10-CM | POA: Diagnosis not present

## 2023-01-10 DIAGNOSIS — F32A Depression, unspecified: Secondary | ICD-10-CM | POA: Diagnosis not present

## 2023-01-10 DIAGNOSIS — Z87891 Personal history of nicotine dependence: Secondary | ICD-10-CM | POA: Diagnosis not present

## 2023-01-10 DIAGNOSIS — K219 Gastro-esophageal reflux disease without esophagitis: Secondary | ICD-10-CM | POA: Diagnosis not present

## 2023-01-10 DIAGNOSIS — Z471 Aftercare following joint replacement surgery: Secondary | ICD-10-CM | POA: Diagnosis not present

## 2023-01-12 DIAGNOSIS — Z96641 Presence of right artificial hip joint: Secondary | ICD-10-CM | POA: Diagnosis not present

## 2023-01-12 DIAGNOSIS — Z86010 Personal history of colonic polyps: Secondary | ICD-10-CM | POA: Diagnosis not present

## 2023-01-12 DIAGNOSIS — K219 Gastro-esophageal reflux disease without esophagitis: Secondary | ICD-10-CM | POA: Diagnosis not present

## 2023-01-12 DIAGNOSIS — Z471 Aftercare following joint replacement surgery: Secondary | ICD-10-CM | POA: Diagnosis not present

## 2023-01-12 DIAGNOSIS — G4730 Sleep apnea, unspecified: Secondary | ICD-10-CM | POA: Diagnosis not present

## 2023-01-12 DIAGNOSIS — H9190 Unspecified hearing loss, unspecified ear: Secondary | ICD-10-CM | POA: Diagnosis not present

## 2023-01-12 DIAGNOSIS — Z87891 Personal history of nicotine dependence: Secondary | ICD-10-CM | POA: Diagnosis not present

## 2023-01-12 DIAGNOSIS — F32A Depression, unspecified: Secondary | ICD-10-CM | POA: Diagnosis not present

## 2023-01-12 DIAGNOSIS — Z9181 History of falling: Secondary | ICD-10-CM | POA: Diagnosis not present

## 2023-01-12 DIAGNOSIS — Z7901 Long term (current) use of anticoagulants: Secondary | ICD-10-CM | POA: Diagnosis not present

## 2023-01-14 DIAGNOSIS — F32A Depression, unspecified: Secondary | ICD-10-CM | POA: Diagnosis not present

## 2023-01-14 DIAGNOSIS — Z9181 History of falling: Secondary | ICD-10-CM | POA: Diagnosis not present

## 2023-01-14 DIAGNOSIS — Z7901 Long term (current) use of anticoagulants: Secondary | ICD-10-CM | POA: Diagnosis not present

## 2023-01-14 DIAGNOSIS — Z87891 Personal history of nicotine dependence: Secondary | ICD-10-CM | POA: Diagnosis not present

## 2023-01-14 DIAGNOSIS — Z471 Aftercare following joint replacement surgery: Secondary | ICD-10-CM | POA: Diagnosis not present

## 2023-01-14 DIAGNOSIS — Z96641 Presence of right artificial hip joint: Secondary | ICD-10-CM | POA: Diagnosis not present

## 2023-01-14 DIAGNOSIS — Z86010 Personal history of colonic polyps: Secondary | ICD-10-CM | POA: Diagnosis not present

## 2023-01-14 DIAGNOSIS — K219 Gastro-esophageal reflux disease without esophagitis: Secondary | ICD-10-CM | POA: Diagnosis not present

## 2023-01-14 DIAGNOSIS — G4730 Sleep apnea, unspecified: Secondary | ICD-10-CM | POA: Diagnosis not present

## 2023-01-14 DIAGNOSIS — H9190 Unspecified hearing loss, unspecified ear: Secondary | ICD-10-CM | POA: Diagnosis not present

## 2023-01-19 DIAGNOSIS — G4730 Sleep apnea, unspecified: Secondary | ICD-10-CM | POA: Diagnosis not present

## 2023-01-19 DIAGNOSIS — Z87891 Personal history of nicotine dependence: Secondary | ICD-10-CM | POA: Diagnosis not present

## 2023-01-19 DIAGNOSIS — Z7901 Long term (current) use of anticoagulants: Secondary | ICD-10-CM | POA: Diagnosis not present

## 2023-01-19 DIAGNOSIS — Z96641 Presence of right artificial hip joint: Secondary | ICD-10-CM | POA: Diagnosis not present

## 2023-01-19 DIAGNOSIS — Z9181 History of falling: Secondary | ICD-10-CM | POA: Diagnosis not present

## 2023-01-19 DIAGNOSIS — Z471 Aftercare following joint replacement surgery: Secondary | ICD-10-CM | POA: Diagnosis not present

## 2023-01-19 DIAGNOSIS — Z86010 Personal history of colonic polyps: Secondary | ICD-10-CM | POA: Diagnosis not present

## 2023-01-19 DIAGNOSIS — K219 Gastro-esophageal reflux disease without esophagitis: Secondary | ICD-10-CM | POA: Diagnosis not present

## 2023-01-19 DIAGNOSIS — H9190 Unspecified hearing loss, unspecified ear: Secondary | ICD-10-CM | POA: Diagnosis not present

## 2023-01-19 DIAGNOSIS — F32A Depression, unspecified: Secondary | ICD-10-CM | POA: Diagnosis not present

## 2023-01-21 DIAGNOSIS — G4730 Sleep apnea, unspecified: Secondary | ICD-10-CM | POA: Diagnosis not present

## 2023-01-21 DIAGNOSIS — Z96641 Presence of right artificial hip joint: Secondary | ICD-10-CM | POA: Diagnosis not present

## 2023-01-21 DIAGNOSIS — H9190 Unspecified hearing loss, unspecified ear: Secondary | ICD-10-CM | POA: Diagnosis not present

## 2023-01-21 DIAGNOSIS — Z87891 Personal history of nicotine dependence: Secondary | ICD-10-CM | POA: Diagnosis not present

## 2023-01-21 DIAGNOSIS — Z9181 History of falling: Secondary | ICD-10-CM | POA: Diagnosis not present

## 2023-01-21 DIAGNOSIS — Z7901 Long term (current) use of anticoagulants: Secondary | ICD-10-CM | POA: Diagnosis not present

## 2023-01-21 DIAGNOSIS — K219 Gastro-esophageal reflux disease without esophagitis: Secondary | ICD-10-CM | POA: Diagnosis not present

## 2023-01-21 DIAGNOSIS — Z471 Aftercare following joint replacement surgery: Secondary | ICD-10-CM | POA: Diagnosis not present

## 2023-01-21 DIAGNOSIS — F32A Depression, unspecified: Secondary | ICD-10-CM | POA: Diagnosis not present

## 2023-01-21 DIAGNOSIS — Z86010 Personal history of colonic polyps: Secondary | ICD-10-CM | POA: Diagnosis not present

## 2023-01-24 DIAGNOSIS — Z96641 Presence of right artificial hip joint: Secondary | ICD-10-CM | POA: Diagnosis not present

## 2023-01-24 DIAGNOSIS — Z7901 Long term (current) use of anticoagulants: Secondary | ICD-10-CM | POA: Diagnosis not present

## 2023-01-24 DIAGNOSIS — Z9181 History of falling: Secondary | ICD-10-CM | POA: Diagnosis not present

## 2023-01-24 DIAGNOSIS — Z87891 Personal history of nicotine dependence: Secondary | ICD-10-CM | POA: Diagnosis not present

## 2023-01-24 DIAGNOSIS — Z471 Aftercare following joint replacement surgery: Secondary | ICD-10-CM | POA: Diagnosis not present

## 2023-01-24 DIAGNOSIS — H9190 Unspecified hearing loss, unspecified ear: Secondary | ICD-10-CM | POA: Diagnosis not present

## 2023-01-24 DIAGNOSIS — G4730 Sleep apnea, unspecified: Secondary | ICD-10-CM | POA: Diagnosis not present

## 2023-01-24 DIAGNOSIS — K219 Gastro-esophageal reflux disease without esophagitis: Secondary | ICD-10-CM | POA: Diagnosis not present

## 2023-01-24 DIAGNOSIS — Z86010 Personal history of colonic polyps: Secondary | ICD-10-CM | POA: Diagnosis not present

## 2023-01-24 DIAGNOSIS — F32A Depression, unspecified: Secondary | ICD-10-CM | POA: Diagnosis not present

## 2023-01-25 ENCOUNTER — Telehealth: Payer: Self-pay

## 2023-01-25 NOTE — Telephone Encounter (Signed)
Patient called in to schedule her colonoscopy. Patient stated that she has been trying to get schedule for 6 months. Patient had a hip replacement 4 weeks ago; patient was taking blood the last blood thinner she took was two weeks ago. Patient stated that she not taking any medication now.

## 2023-01-26 ENCOUNTER — Other Ambulatory Visit: Payer: Self-pay

## 2023-01-26 ENCOUNTER — Telehealth: Payer: Self-pay

## 2023-01-26 DIAGNOSIS — Z800 Family history of malignant neoplasm of digestive organs: Secondary | ICD-10-CM

## 2023-01-26 DIAGNOSIS — Z86010 Personal history of colonic polyps: Secondary | ICD-10-CM

## 2023-01-26 MED ORDER — NA SULFATE-K SULFATE-MG SULF 17.5-3.13-1.6 GM/177ML PO SOLN: 1.0000 | Freq: Once | ORAL | 0 refills | Status: AC

## 2023-01-26 NOTE — Telephone Encounter (Signed)
Gastroenterology Pre-Procedure Review  Request Date: 03/28/23 Requesting Physician: Dr. Servando Snare  PATIENT REVIEW QUESTIONS: The patient responded to the following health history questions as indicated:    1. Are you having any GI issues? yes (gas, and constipation related to recent hip surgery 12/2023) 2. Do you have a personal history of Polyps? yes (08/05/2015 colonoscopy report performed in [place] noted diverticulosis and colon polyps ) 3. Do you have a family history of Colon Cancer or Polyps? yes (mother colon cancer) 4. Diabetes Mellitus? no 5. Joint replacements in the past 12 months?yes (total hip replacement in December 28, 2022) 6. Major health problems in the past 3 months?no 7. Any artificial heart valves, MVP, or defibrillator?no    MEDICATIONS & ALLERGIES:    Patient reports the following regarding taking any anticoagulation/antiplatelet therapy:   Plavix, Coumadin, Eliquis, Xarelto, Lovenox, Pradaxa, Brilinta, or Effient? no completed Eliquis 2 weeks ago Aspirin? no  Patient confirms/reports the following medications:  Current Outpatient Medications  Medication Sig Dispense Refill   acetaminophen (TYLENOL) 325 MG tablet Take 650 mg by mouth every 6 (six) hours as needed.     acetaminophen (TYLENOL) 500 MG tablet Take 2 tablets (1,000 mg total) by mouth every 6 (six) hours. 60 tablet 0   albuterol (VENTOLIN HFA) 108 (90 Base) MCG/ACT inhaler Inhale 2 puffs into the lungs every 4 (four) hours as needed. 1 each 1   apixaban (ELIQUIS) 2.5 MG TABS tablet Take 1 tablet (2.5 mg total) by mouth 2 (two) times daily. 30 tablet 0   Biotin 5000 MCG CAPS Take by mouth.     Calcium Carbonate-Vit D-Min (CALCIUM 1200 PO) Take by mouth.     Cyanocobalamin (VITAMIN B 12 PO) Take by mouth.     escitalopram (LEXAPRO) 10 MG tablet TAKE 1 TABLET(10 MG) BY MOUTH DAILY (Patient not taking: Reported on 12/20/2022) 90 tablet 0   Garlic 10 MG CAPS garlic     loratadine (CLARITIN) 10 MG tablet Take 10 mg by  mouth daily.     Multiple Vitamin (MULTIVITAMIN) capsule Take 1 capsule by mouth daily.     Omega-3 Fatty Acids (FISH OIL) 1000 MG CAPS Take 1 capsule by mouth daily. (Patient not taking: Reported on 12/20/2022)     ondansetron (ZOFRAN) 4 MG tablet Take 1 tablet (4 mg total) by mouth every 6 (six) hours as needed for nausea. 30 tablet 0   oxyCODONE (OXY IR/ROXICODONE) 5 MG immediate release tablet Take 1-2 tablets (5-10 mg total) by mouth every 4 (four) hours as needed for moderate pain (pain score 4-6). 40 tablet 0   No current facility-administered medications for this visit.    Patient confirms/reports the following allergies:  Allergies  Allergen Reactions   Hydrocodone Other (See Comments)   Penicillins Swelling    Mouth swelling     No orders of the defined types were placed in this encounter.   AUTHORIZATION INFORMATION Primary Insurance: 1D#: Group #:  Secondary Insurance: 1D#: Group #:  SCHEDULE INFORMATION: Date: 03/28/23 Time: Location: [facility]

## 2023-01-27 DIAGNOSIS — Z9181 History of falling: Secondary | ICD-10-CM | POA: Diagnosis not present

## 2023-01-27 DIAGNOSIS — K219 Gastro-esophageal reflux disease without esophagitis: Secondary | ICD-10-CM | POA: Diagnosis not present

## 2023-01-27 DIAGNOSIS — Z7901 Long term (current) use of anticoagulants: Secondary | ICD-10-CM | POA: Diagnosis not present

## 2023-01-27 DIAGNOSIS — Z96641 Presence of right artificial hip joint: Secondary | ICD-10-CM | POA: Diagnosis not present

## 2023-01-27 DIAGNOSIS — Z471 Aftercare following joint replacement surgery: Secondary | ICD-10-CM | POA: Diagnosis not present

## 2023-01-27 DIAGNOSIS — G4730 Sleep apnea, unspecified: Secondary | ICD-10-CM | POA: Diagnosis not present

## 2023-01-27 DIAGNOSIS — Z87891 Personal history of nicotine dependence: Secondary | ICD-10-CM | POA: Diagnosis not present

## 2023-01-27 DIAGNOSIS — F32A Depression, unspecified: Secondary | ICD-10-CM | POA: Diagnosis not present

## 2023-01-27 DIAGNOSIS — H9190 Unspecified hearing loss, unspecified ear: Secondary | ICD-10-CM | POA: Diagnosis not present

## 2023-01-27 DIAGNOSIS — Z86010 Personal history of colonic polyps: Secondary | ICD-10-CM | POA: Diagnosis not present

## 2023-01-28 ENCOUNTER — Telehealth: Payer: Self-pay | Admitting: Family Medicine

## 2023-01-28 NOTE — Telephone Encounter (Signed)
Copied from CRM [id number]. Topic: Medicare AWV >> Jan 28, 2023 10:14 AM Payton Doughty wrote: Reason for CRM: LM 01/28/2023 to schedule AWV   Verlee Rossetti; Care Guide Ambulatory Clinical Support [facility] Direct Dial: [phone]

## 2023-02-01 DIAGNOSIS — K219 Gastro-esophageal reflux disease without esophagitis: Secondary | ICD-10-CM | POA: Diagnosis not present

## 2023-02-01 DIAGNOSIS — Z86010 Personal history of colonic polyps: Secondary | ICD-10-CM | POA: Diagnosis not present

## 2023-02-01 DIAGNOSIS — F32A Depression, unspecified: Secondary | ICD-10-CM | POA: Diagnosis not present

## 2023-02-01 DIAGNOSIS — Z471 Aftercare following joint replacement surgery: Secondary | ICD-10-CM | POA: Diagnosis not present

## 2023-02-01 DIAGNOSIS — Z7901 Long term (current) use of anticoagulants: Secondary | ICD-10-CM | POA: Diagnosis not present

## 2023-02-01 DIAGNOSIS — G4730 Sleep apnea, unspecified: Secondary | ICD-10-CM | POA: Diagnosis not present

## 2023-02-01 DIAGNOSIS — H9190 Unspecified hearing loss, unspecified ear: Secondary | ICD-10-CM | POA: Diagnosis not present

## 2023-02-01 DIAGNOSIS — Z9181 History of falling: Secondary | ICD-10-CM | POA: Diagnosis not present

## 2023-02-01 DIAGNOSIS — Z96641 Presence of right artificial hip joint: Secondary | ICD-10-CM | POA: Diagnosis not present

## 2023-02-01 DIAGNOSIS — Z87891 Personal history of nicotine dependence: Secondary | ICD-10-CM | POA: Diagnosis not present

## 2023-02-03 DIAGNOSIS — Z96641 Presence of right artificial hip joint: Secondary | ICD-10-CM | POA: Diagnosis not present

## 2023-02-03 DIAGNOSIS — H9190 Unspecified hearing loss, unspecified ear: Secondary | ICD-10-CM | POA: Diagnosis not present

## 2023-02-03 DIAGNOSIS — Z86010 Personal history of colonic polyps: Secondary | ICD-10-CM | POA: Diagnosis not present

## 2023-02-03 DIAGNOSIS — Z87891 Personal history of nicotine dependence: Secondary | ICD-10-CM | POA: Diagnosis not present

## 2023-02-03 DIAGNOSIS — G4730 Sleep apnea, unspecified: Secondary | ICD-10-CM | POA: Diagnosis not present

## 2023-02-03 DIAGNOSIS — F32A Depression, unspecified: Secondary | ICD-10-CM | POA: Diagnosis not present

## 2023-02-03 DIAGNOSIS — Z9181 History of falling: Secondary | ICD-10-CM | POA: Diagnosis not present

## 2023-02-03 DIAGNOSIS — Z471 Aftercare following joint replacement surgery: Secondary | ICD-10-CM | POA: Diagnosis not present

## 2023-02-03 DIAGNOSIS — K219 Gastro-esophageal reflux disease without esophagitis: Secondary | ICD-10-CM | POA: Diagnosis not present

## 2023-02-03 DIAGNOSIS — Z7901 Long term (current) use of anticoagulants: Secondary | ICD-10-CM | POA: Diagnosis not present

## 2023-02-07 DIAGNOSIS — H9190 Unspecified hearing loss, unspecified ear: Secondary | ICD-10-CM | POA: Diagnosis not present

## 2023-02-07 DIAGNOSIS — Z86010 Personal history of colonic polyps: Secondary | ICD-10-CM | POA: Diagnosis not present

## 2023-02-07 DIAGNOSIS — F32A Depression, unspecified: Secondary | ICD-10-CM | POA: Diagnosis not present

## 2023-02-07 DIAGNOSIS — Z7901 Long term (current) use of anticoagulants: Secondary | ICD-10-CM | POA: Diagnosis not present

## 2023-02-07 DIAGNOSIS — Z471 Aftercare following joint replacement surgery: Secondary | ICD-10-CM | POA: Diagnosis not present

## 2023-02-07 DIAGNOSIS — Z87891 Personal history of nicotine dependence: Secondary | ICD-10-CM | POA: Diagnosis not present

## 2023-02-07 DIAGNOSIS — Z96641 Presence of right artificial hip joint: Secondary | ICD-10-CM | POA: Diagnosis not present

## 2023-02-07 DIAGNOSIS — Z9181 History of falling: Secondary | ICD-10-CM | POA: Diagnosis not present

## 2023-02-07 DIAGNOSIS — K219 Gastro-esophageal reflux disease without esophagitis: Secondary | ICD-10-CM | POA: Diagnosis not present

## 2023-02-07 DIAGNOSIS — G4730 Sleep apnea, unspecified: Secondary | ICD-10-CM | POA: Diagnosis not present

## 2023-02-11 DIAGNOSIS — Z96641 Presence of right artificial hip joint: Secondary | ICD-10-CM | POA: Diagnosis not present

## 2023-02-11 DIAGNOSIS — Z87891 Personal history of nicotine dependence: Secondary | ICD-10-CM | POA: Diagnosis not present

## 2023-02-11 DIAGNOSIS — Z7901 Long term (current) use of anticoagulants: Secondary | ICD-10-CM | POA: Diagnosis not present

## 2023-02-11 DIAGNOSIS — Z86010 Personal history of colonic polyps: Secondary | ICD-10-CM | POA: Diagnosis not present

## 2023-02-11 DIAGNOSIS — Z9181 History of falling: Secondary | ICD-10-CM | POA: Diagnosis not present

## 2023-02-11 DIAGNOSIS — H9190 Unspecified hearing loss, unspecified ear: Secondary | ICD-10-CM | POA: Diagnosis not present

## 2023-02-11 DIAGNOSIS — F32A Depression, unspecified: Secondary | ICD-10-CM | POA: Diagnosis not present

## 2023-02-11 DIAGNOSIS — Z471 Aftercare following joint replacement surgery: Secondary | ICD-10-CM | POA: Diagnosis not present

## 2023-02-11 DIAGNOSIS — K219 Gastro-esophageal reflux disease without esophagitis: Secondary | ICD-10-CM | POA: Diagnosis not present

## 2023-02-11 DIAGNOSIS — G4730 Sleep apnea, unspecified: Secondary | ICD-10-CM | POA: Diagnosis not present

## 2023-02-16 DIAGNOSIS — M1611 Unilateral primary osteoarthritis, right hip: Secondary | ICD-10-CM | POA: Diagnosis not present

## 2023-02-16 DIAGNOSIS — Z96641 Presence of right artificial hip joint: Secondary | ICD-10-CM | POA: Diagnosis not present

## 2023-03-17 ENCOUNTER — Encounter: Payer: Self-pay | Admitting: Gastroenterology

## 2023-03-17 ENCOUNTER — Other Ambulatory Visit: Payer: Self-pay

## 2023-03-18 ENCOUNTER — Encounter: Payer: Self-pay | Admitting: Family Medicine

## 2023-03-18 NOTE — Telephone Encounter (Signed)
Please review.  KP

## 2023-03-24 ENCOUNTER — Telehealth: Payer: Self-pay

## 2023-03-24 ENCOUNTER — Ambulatory Visit: Payer: Self-pay

## 2023-03-24 NOTE — Telephone Encounter (Signed)
Pt has colonoscopy scheduled for 03/28/2023 she has a question in reference to procedure

## 2023-03-24 NOTE — Telephone Encounter (Signed)
  Chief Complaint: Urinary frequency& pressure, cloudy urine Symptoms: above Frequency: 8-9 days Pertinent Negatives: Patient denies fever, blood in urine Disposition: [] ED /[] Urgent Care (no appt availability in office) / [x] Appointment(In office/virtual)/ []  [place] [facility]/ [] Home Care/ [] Refused Recommended Disposition /[] [facility] [facility]/ []  Follow-up with PCP Additional Notes: Pt states s/s lasting 8-9 days. Pt unable to take available appt for this afternoon. Appt made for tomorrow. Pt would much rather come to the office today and give sample for testing please advise.  Summary: possible UTI + frequent urination   Patient called and stated she is having a colonoscopy done next Tuesday, 03/29/2023 but states she has developed a possible UTI, frequent urination and pressure, and would like an antibiotic called in for this. Patient states she spoke with the clinic where she is having the colonoscopy and they told her an antibiotic is okay.  Patients callback # [phone]     Reason for Disposition  Urinating more frequently than usual (i.e., frequency)  Answer Assessment - Initial Assessment Questions 1. SYMPTOM: "What's the main symptom you're concerned about?" (e.g., frequency, incontinence)     Frequency, pressure, cloudy urine 2. ONSET: "When did the  s/s  start?"     8-9 days ago 3. PAIN: "Is there any pain?" If Yes, ask: "How bad is it?" (Scale: 1-10; mild, moderate, severe)     Mild lower back pain 4. CAUSE: "What do you think is causing the symptoms?"     UTI 5. OTHER SYMPTOMS: "Do you have any other symptoms?" (e.g., blood in urine, fever, flank pain, pain with urination)     Mild flank pain  Protocols used: Urinary Symptoms-A-AH

## 2023-03-24 NOTE — Telephone Encounter (Signed)
Patient contacted office because she thinks she may have the makings of an UTI and wanted to know if she takes an antibiotic for it would it be okay.  I informed her that she can take an antibiotic for it.  She then asked if we could send her in a rx for it.  I informed her that we do not treat urinary tract infections that care would come from a urologist or her PCP.  [facility] gastroenterology and we treat stomach issues.  I asked if she would like to reschedule her colonoscopy and she said no she would not.  Thanks,  [place], [place]

## 2023-03-25 ENCOUNTER — Encounter: Payer: Self-pay | Admitting: Family Medicine

## 2023-03-25 ENCOUNTER — Ambulatory Visit (INDEPENDENT_AMBULATORY_CARE_PROVIDER_SITE_OTHER): Payer: Medicare Other | Admitting: Family Medicine

## 2023-03-25 VITALS — BP 110/78 | HR 88 | Ht 64.5 in | Wt 188.0 lb

## 2023-03-25 DIAGNOSIS — N3090 Cystitis, unspecified without hematuria: Secondary | ICD-10-CM

## 2023-03-25 DIAGNOSIS — R300 Dysuria: Secondary | ICD-10-CM

## 2023-03-25 LAB — POCT URINALYSIS DIPSTICK
Bilirubin, UA: NEGATIVE
Glucose, UA: NEGATIVE
Ketones, UA: NEGATIVE
Nitrite, UA: NEGATIVE
Protein, UA: NEGATIVE
Spec Grav, UA: 1.01 (ref 1.010–1.025)
Urobilinogen, UA: 0.2 E.U./dL
pH, UA: 6 (ref 5.0–8.0)

## 2023-03-25 MED ORDER — NITROFURANTOIN MONOHYD MACRO 100 MG PO CAPS
100.0000 mg | ORAL_CAPSULE | Freq: Two times a day (BID) | ORAL | 0 refills | Status: AC
Start: 2023-03-25 — End: 2023-04-01

## 2023-03-25 NOTE — Progress Notes (Signed)
Date:  03/25/2023   Name:  Tara Olson   DOB:  07/28/1950   MRN:  [id number]   Chief Complaint: Urinary Tract Infection (Pressure when goes to bathroom)  Urinary Tract Infection  This is a new problem. The current episode started in the past 7 days (8 days). The problem has been unchanged. The quality of the pain is described as burning. The pain is mild. There has been no fever. Associated symptoms include frequency and urgency. Pertinent negatives include no chills, discharge, flank pain, hematuria or hesitancy. Treatments tried: azo/urine acidification.    Lab Results  Component Value Date   NA 137 12/29/2022   K 4.6 12/29/2022   CO2 24 12/29/2022   GLUCOSE 159 (H) 12/29/2022   BUN 23 12/29/2022   CREATININE 0.79 12/29/2022   CALCIUM 8.4 (L) 12/29/2022   EGFR 73 03/30/2022   GFRNONAA >60 12/29/2022   Lab Results  Component Value Date   CHOL 289 (H) 03/30/2022   HDL 59 03/30/2022   LDLCALC 205 (H) 03/30/2022   TRIG 136 03/30/2022   No results found for: "TSH" No results found for: "HGBA1C" Lab Results  Component Value Date   WBC 10.9 (H) 12/29/2022   HGB 12.2 12/29/2022   HCT 37.5 12/29/2022   MCV 93.8 12/29/2022   PLT 214 12/29/2022   Lab Results  Component Value Date   ALT 25 12/20/2022   AST 22 12/20/2022   ALKPHOS 54 12/20/2022   BILITOT 0.7 12/20/2022   No results found for: "25OHVITD2", "25OHVITD3", "VD25OH"   Review of Systems  Constitutional:  Negative for chills.  HENT:  Negative for hearing loss.   Respiratory:  Positive for shortness of breath. Negative for choking and wheezing.   Cardiovascular:  Negative for chest pain, palpitations and leg swelling.  Gastrointestinal:  Negative for blood in stool.  Genitourinary:  Positive for dysuria, frequency and urgency. Negative for flank pain, hematuria, hesitancy and vaginal bleeding.    Patient Active Problem List   Diagnosis Date Noted   Status post total hip replacement, right 12/28/2022    Complex sclerosing lesion of right breast 06/04/2022   Primary osteoarthritis of right hip 03/31/2022   Tendinitis of upper biceps tendon of right shoulder 12/23/2021   Nontraumatic complete tear of right rotator cuff 10/28/2021   Rotator cuff tendinitis, right 10/28/2021   PVC's (premature ventricular contractions) 10/31/2020   Fibrocystic breast 08/06/2020   Closed fracture dislocation of right ankle with routine healing 07/20/2020   Former smoker 07/17/2020   Hearing loss 07/17/2020   Polyp of colon 07/17/2020   Snoring 07/17/2020   Screen for colon cancer 08/05/2015   Environmental allergies 05/19/2015    Allergies  Allergen Reactions   Hydrocodone Other (See Comments)    Hallucinations   Penicillins Swelling    Mouth swelling     Past Surgical History:  Procedure Laterality Date   BREAST BIOPSY Right 08/04/2020   Ultrasound biopsy   BREAST BIOPSY Right 05/31/2022   Right Breast distortion Ribbon Clip - path pending   BREAST BIOPSY Right 05/31/2022   MM RT BREAST BX W LOC DEV 1ST LESION IMAGE BX SPEC STEREO GUIDE 05/31/2022 [facility]   EXPLORATORY LAPAROTOMY  1989   SHOULDER ARTHROSCOPY WITH SUBACROMIAL DECOMPRESSION, ROTATOR CUFF REPAIR AND BICEP TENDON REPAIR Right 12/22/2021   Procedure: RIGHT SHOULDER ARTHROSCOPY WITH DEBRIDEMENT, DECOMPRESSION, ROTATOR CUFF REPAIR, AND BICEPS TENODESIS;  Surgeon: Christena Flake, MD;  Location: [facility];  Service: Orthopedics;  Laterality: Right;  TOTAL HIP ARTHROPLASTY Right 12/28/2022   Procedure: TOTAL HIP ARTHROPLASTY;  Surgeon: Christena Flake, MD;  Location: [facility];  Service: Orthopedics;  Laterality: Right;    Social History   Tobacco Use   Smoking status: Former    Current packs/day: 0.00    Average packs/day: 1 pack/day for 35.0 years (35.0 ttl pk-yrs)    Types: Cigarettes    Start date: 07/05/1969    Quit date: 07/05/2004    Years since quitting: 18.7    Passive exposure: Past   Smokeless tobacco: Never  Vaping  Use   Vaping status: Never Used  Substance Use Topics   Alcohol use: Yes    Alcohol/week: 3.0 standard drinks of alcohol    Types: 3 Glasses of wine per week    Comment: occ.   Drug use: Never     Medication list has been reviewed and updated.  No outpatient medications have been marked as taking for the 03/25/23 encounter (Office Visit) with Duanne Limerick, MD.       03/25/2023   10:59 AM 03/30/2022    9:40 AM 01/21/2022    1:38 PM  GAD 7 : Generalized Anxiety Score  Nervous, Anxious, on Edge 0 0 0  Control/stop worrying 0 0 0  Worry too much - different things 0 0 1  Trouble relaxing 0 0 0  Restless 0 0 0  Easily annoyed or irritable 0 0 0  Afraid - awful might happen 0 0 1  Total GAD 7 Score 0 0 2  Anxiety Difficulty Not difficult at all Not difficult at all Not difficult at all       03/25/2023   10:59 AM 03/30/2022    9:40 AM 01/21/2022    1:38 PM  Depression screen PHQ 2/9  Decreased Interest 0 0 2  Down, Depressed, Hopeless 0 0 1  PHQ - 2 Score 0 0 3  Altered sleeping 0 0 1  Tired, decreased energy 0 0 1  Change in appetite 0 0 1  Feeling bad or failure about yourself  0 0 0  Trouble concentrating 0 0 1  Moving slowly or fidgety/restless 0 0 1  Suicidal thoughts 0 0 0  PHQ-9 Score 0 0 8  Difficult doing work/chores Not difficult at all Not difficult at all Not difficult at all    BP Readings from Last 3 Encounters:  03/25/23 110/78  12/29/22 135/65  12/20/22 (!) 152/65    Physical Exam HENT:     Mouth/Throat:     Mouth: Mucous membranes are moist.  Cardiovascular:     Rate and Rhythm: Normal rate and regular rhythm.     Heart sounds: No murmur heard.    No friction rub. No gallop.  Pulmonary:     Breath sounds: No wheezing, rhonchi or rales.  Abdominal:     General: Bowel sounds are normal.     Tenderness: There is abdominal tenderness. There is no right CVA tenderness, left CVA tenderness or guarding.  Musculoskeletal:     Cervical back:  Normal range of motion.  Neurological:     Mental Status: She is alert.     Wt Readings from Last 3 Encounters:  03/25/23 188 lb (85.3 kg)  12/28/22 175 lb (79.4 kg)  12/20/22 179 lb 9.6 oz (81.5 kg)    BP 110/78   Pulse 88   Ht 5' 4.5" (1.638 m)   Wt 188 lb (85.3 kg)   SpO2 96%   BMI 31.77  kg/m   Assessment and Plan:  1. Dysuria New onset.  Persistent.  Relatively stable but remains symptomatic despite Azo and citrus acid acidification of the urine.  Patient has no fever or chills nor CVA tenderness.  Urine analysis was done which is consistent with cystitis with - POCT urinalysis dipstick  2. Cystitis Urinalysis was done which is consistent with cystitis with leukocytes and erythrocytes noted.  This is consistent with cystitis and we will treat with nitrofurantoin 100 mg twice a day seeing that patient has an allergy to - nitrofurantoin, macrocrystal-monohydrate, (MACROBID) 100 MG capsule; Take 1 capsule (100 mg total) by mouth 2 (two) times daily for 7 days.  Dispense: 14 capsule; Refill: 0    Elizabeth Sauer, MD

## 2023-03-28 ENCOUNTER — Other Ambulatory Visit: Payer: Self-pay

## 2023-03-28 ENCOUNTER — Encounter: Payer: Self-pay | Admitting: Gastroenterology

## 2023-03-28 ENCOUNTER — Encounter
Admission: RE | Disposition: A | Discharge status: Home / Self Care | Payer: Self-pay | Source: Home / Self Care | Attending: Gastroenterology

## 2023-03-28 ENCOUNTER — Ambulatory Visit
Admission: RE | Admit: 2023-03-28 | Discharge: 2023-03-28 | Disposition: A | Discharge status: Home / Self Care | Payer: Medicare Other | Attending: Gastroenterology | Admitting: Gastroenterology

## 2023-03-28 ENCOUNTER — Ambulatory Visit: Payer: Medicare Other | Admitting: Anesthesiology

## 2023-03-28 DIAGNOSIS — K219 Gastro-esophageal reflux disease without esophagitis: Secondary | ICD-10-CM | POA: Diagnosis not present

## 2023-03-28 DIAGNOSIS — K635 Polyp of colon: Secondary | ICD-10-CM | POA: Diagnosis not present

## 2023-03-28 DIAGNOSIS — K641 Second degree hemorrhoids: Secondary | ICD-10-CM | POA: Diagnosis not present

## 2023-03-28 DIAGNOSIS — Z800 Family history of malignant neoplasm of digestive organs: Secondary | ICD-10-CM

## 2023-03-28 DIAGNOSIS — K5730 Diverticulosis of large intestine without perforation or abscess without bleeding: Secondary | ICD-10-CM | POA: Insufficient documentation

## 2023-03-28 DIAGNOSIS — Z87891 Personal history of nicotine dependence: Secondary | ICD-10-CM | POA: Insufficient documentation

## 2023-03-28 DIAGNOSIS — Z860100 Personal history of colon polyps, unspecified: Secondary | ICD-10-CM

## 2023-03-28 DIAGNOSIS — G4730 Sleep apnea, unspecified: Secondary | ICD-10-CM | POA: Insufficient documentation

## 2023-03-28 DIAGNOSIS — Z1211 Encounter for screening for malignant neoplasm of colon: Secondary | ICD-10-CM | POA: Insufficient documentation

## 2023-03-28 DIAGNOSIS — Z7901 Long term (current) use of anticoagulants: Secondary | ICD-10-CM | POA: Diagnosis not present

## 2023-03-28 DIAGNOSIS — D125 Benign neoplasm of sigmoid colon: Secondary | ICD-10-CM | POA: Insufficient documentation

## 2023-03-28 HISTORY — PX: COLONOSCOPY WITH PROPOFOL: SHX5780

## 2023-03-28 HISTORY — DX: Unspecified hearing loss, unspecified ear: H91.90

## 2023-03-28 SURGERY — COLONOSCOPY WITH PROPOFOL
Anesthesia: General

## 2023-03-28 MED ORDER — PROPOFOL 10 MG/ML IV BOLUS
INTRAVENOUS | Status: DC | PRN
  Administered 2023-03-28 (×2): 30 mg via INTRAVENOUS
  Administered 2023-03-28 (×2): 20 mg via INTRAVENOUS
  Administered 2023-03-28: 30 mg via INTRAVENOUS
  Administered 2023-03-28: 90 mg via INTRAVENOUS
  Administered 2023-03-28 (×2): 30 mg via INTRAVENOUS
  Administered 2023-03-28: 20 mg via INTRAVENOUS

## 2023-03-28 MED ORDER — PROPOFOL 10 MG/ML IV BOLUS
INTRAVENOUS | Status: AC
  Filled 2023-03-28: qty 40

## 2023-03-28 MED ORDER — STERILE WATER FOR IRRIGATION IR SOLN: Status: DC | PRN

## 2023-03-28 MED ORDER — STERILE WATER FOR IRRIGATION IR SOLN
Status: DC | PRN
  Administered 2023-03-28: 500 mL

## 2023-03-28 MED ORDER — LIDOCAINE HCL (CARDIAC) PF 100 MG/5ML IV SOSY
PREFILLED_SYRINGE | INTRAVENOUS | Status: DC | PRN
  Administered 2023-03-28: 50 mg via INTRAVENOUS

## 2023-03-28 MED ORDER — LACTATED RINGERS IV SOLN: INTRAVENOUS | Status: DC

## 2023-03-28 MED ORDER — LIDOCAINE HCL (PF) 2 % IJ SOLN
INTRAMUSCULAR | Status: AC
  Filled 2023-03-28: qty 5

## 2023-03-28 MED ORDER — SODIUM CHLORIDE 0.9 % IV SOLN: INTRAVENOUS | Status: DC

## 2023-03-28 SURGICAL SUPPLY — 7 items
FORCEPS BIOP RAD 4 LRG CAP 4 (CUTTING FORCEPS) IMPLANT
GOWN CVR UNV OPN BCK APRN NK (MISCELLANEOUS) ×2 IMPLANT
GOWN ISOL THUMB LOOP REG UNIV (MISCELLANEOUS) ×2
KIT PRC NS LF DISP ENDO (KITS) ×1 IMPLANT
KIT PROCEDURE OLYMPUS (KITS) ×1
MANIFOLD NEPTUNE II (INSTRUMENTS) ×1 IMPLANT
WATER STERILE IRR 250ML POUR (IV SOLUTION) ×1 IMPLANT

## 2023-03-28 NOTE — Op Note (Signed)
[facility] Gastroenterology Patient Name: Tara Olson Procedure Date: 03/28/2023 8:09 AM MRN: [id number] Account #: [id number] Date of Birth: [birth year] Admit Type: Outpatient Age: 72 Room: [facility] OR ROOM 01 Gender: Female Note Status: Finalized Instrument Name: [id number] Procedure:             Colonoscopy Providers:             [name] MD, MD Referring MD:          Duanne Limerick, MD (Referring MD) Medicines:             Propofol per Anesthesia Complications:         No immediate complications. Procedure:             Pre-Anesthesia Assessment:                        - Prior to the procedure, a History and Physical was                         performed, and patient medications and allergies were                         reviewed. The patient's tolerance of previous                         anesthesia was also reviewed. The risks and benefits                         of the procedure and the sedation options and risks                         were discussed with the patient. All questions were                         answered, and informed consent was obtained. Prior                         Anticoagulants: The patient has taken no anticoagulant                         or antiplatelet agents. ASA Grade Assessment: II - A                         patient with mild systemic disease. After reviewing                         the risks and benefits, the patient was deemed in                         satisfactory condition to undergo the procedure.                        After obtaining informed consent, the colonoscope was                         passed under direct vision. Throughout the procedure,                         the patient's blood  pressure, pulse, and oxygen                         saturations were monitored continuously. The was                         introduced through the anus and advanced to the the                         cecum, identified by appendiceal  orifice and ileocecal                         valve. The colonoscopy was performed without                         difficulty. The patient tolerated the procedure well.                         The quality of the bowel preparation was excellent. Findings:      The perianal and digital rectal examinations were normal.      A 3 mm polyp was found in the sigmoid colon. The polyp was sessile. The       polyp was removed with a cold biopsy forceps. Resection and retrieval       were complete.      Multiple small-mouthed diverticula were found in the entire colon.      Non-bleeding internal hemorrhoids were found during retroflexion. The       hemorrhoids were Grade II (internal hemorrhoids that prolapse but reduce       spontaneously). Impression:            - One 3 mm polyp in the sigmoid colon, removed with a                         cold biopsy forceps. Resected and retrieved.                        - Diverticulosis in the entire examined colon.                        - Non-bleeding internal hemorrhoids. Recommendation:        - Discharge patient to home.                        - Resume previous diet.                        - Continue present medications.                        - Repeat colonoscopy in 5 years for surveillance. Procedure Code(s):     --- Professional ---                        [phone], Colonoscopy, flexible; with biopsy, single or                         multiple Diagnosis Code(s):     --- Professional ---  D12.5, Benign neoplasm of sigmoid colon CPT copyright 2022 American Medical Association. All rights reserved. The codes documented in this report are preliminary and upon coder review may  be revised to meet current compliance requirements. [name] MD, MD 03/28/2023 8:38:31 AM This report has been signed electronically. Number of Addenda: 0 Note Initiated On: 03/28/2023 8:09 AM Scope Withdrawal Time: 0 hours 10 minutes 40 seconds  Total  Procedure Duration: 0 hours 15 minutes 25 seconds  Estimated Blood Loss:  Estimated blood loss: none.      [facility]

## 2023-03-28 NOTE — H&P (Signed)
[name], MD [facility] [address], [address] [place], [place] [address] Phone:[phone] Fax : [phone]  Primary Care Physician:  Duanne Limerick, MD Primary Gastroenterologist:  Dr. Servando Snare  Pre-Procedure History & Physical: HPI:  Tara Olson is a 72 y.o. female is here for an colonoscopy.   Past Medical History:  Diagnosis Date   Arthritis    joints, hands   Depression    Dyspnea    GERD (gastroesophageal reflux disease)    HOH (hard of hearing)    Wears aides bilateral   Sleep apnea    Uses CPAP    Past Surgical History:  Procedure Laterality Date   BREAST BIOPSY Right 08/04/2020   Ultrasound biopsy   BREAST BIOPSY Right 05/31/2022   Right Breast distortion Ribbon Clip - path pending   BREAST BIOPSY Right 05/31/2022   MM RT BREAST BX W LOC DEV 1ST LESION IMAGE BX SPEC STEREO GUIDE 05/31/2022 [facility]   EXPLORATORY LAPAROTOMY  1989   SHOULDER ARTHROSCOPY WITH SUBACROMIAL DECOMPRESSION, ROTATOR CUFF REPAIR AND BICEP TENDON REPAIR Right 12/22/2021   Procedure: RIGHT SHOULDER ARTHROSCOPY WITH DEBRIDEMENT, DECOMPRESSION, ROTATOR CUFF REPAIR, AND BICEPS TENODESIS;  Surgeon: Christena Flake, MD;  Location: [facility];  Service: Orthopedics;  Laterality: Right;   TOTAL HIP ARTHROPLASTY Right 12/28/2022   Procedure: TOTAL HIP ARTHROPLASTY;  Surgeon: Christena Flake, MD;  Location: [facility];  Service: Orthopedics;  Laterality: Right;    Prior to Admission medications   Medication Sig Start Date End Date Taking? Authorizing Provider  Biotin 5000 MCG CAPS Take by mouth.   Yes [provider]  Calcium Carbonate-Vit D-Min (CALCIUM 1200 PO) Take by mouth.   Yes [provider]  Garlic 10 MG CAPS garlic   Yes [provider]  Multiple Vitamin (MULTIVITAMIN) capsule Take 1 capsule by mouth daily.   Yes [provider]  nitrofurantoin, macrocrystal-monohydrate, (MACROBID) 100 MG capsule Take 1 capsule (100 mg total) by mouth 2 (two) times  daily for 7 days. 03/25/23 04/01/23 Yes Duanne Limerick, MD  acetaminophen (TYLENOL) 325 MG tablet Take 650 mg by mouth every 6 (six) hours as needed.    [provider]  acetaminophen (TYLENOL) 500 MG tablet Take 2 tablets (1,000 mg total) by mouth every 6 (six) hours. 12/29/22   Anson Oregon, PA-C  albuterol (VENTOLIN HFA) 108 (90 Base) MCG/ACT inhaler Inhale 2 puffs into the lungs every 4 (four) hours as needed. 05/07/22   Duanne Limerick, MD  apixaban (ELIQUIS) 2.5 MG TABS tablet Take 1 tablet (2.5 mg total) by mouth 2 (two) times daily. Patient not taking: Reported on 01/26/2023 12/29/22   Anson Oregon, PA-C  Cyanocobalamin (VITAMIN B 12 PO) Take by mouth. Patient not taking: Reported on 03/28/2023    [provider]  escitalopram (LEXAPRO) 10 MG tablet TAKE 1 TABLET(10 MG) BY MOUTH DAILY Patient not taking: Reported on 12/20/2022 04/30/22   Duanne Limerick, MD  loratadine (CLARITIN) 10 MG tablet Take 10 mg by mouth daily. Patient not taking: Reported on 03/17/2023    [provider]  Omega-3 Fatty Acids (FISH OIL) 1000 MG CAPS Take 1 capsule by mouth daily. Patient not taking: Reported on 12/20/2022    [provider]  ondansetron (ZOFRAN) 4 MG tablet Take 1 tablet (4 mg total) by mouth every 6 (six) hours as needed for nausea. Patient not taking: Reported on 03/17/2023 12/29/22   Anson Oregon, PA-C  oxyCODONE (OXY IR/ROXICODONE) 5 MG immediate release  tablet Take 1-2 tablets (5-10 mg total) by mouth every 4 (four) hours as needed for moderate pain (pain score 4-6). Patient not taking: Reported on 03/17/2023 12/29/22   Anson Oregon, PA-C    Allergies as of 01/26/2023 - Review Complete 12/28/2022  Allergen Reaction Noted   Hydrocodone Other (See Comments) 12/20/2022   Penicillins Swelling 12/15/2021    Family History  Problem Relation Age of Onset   Colon cancer Mother    Diabetes Father    Hypertension Father    Heart disease  Father    Heart disease Brother    Breast cancer Maternal Aunt     Social History   Socioeconomic History   Marital status: Single    Spouse name: Not on file   Number of children: Not on file   Years of education: Not on file   Highest education level: Not on file  Occupational History   Not on file  Tobacco Use   Smoking status: Former    Current packs/day: 0.00    Average packs/day: 1 pack/day for 35.0 years (35.0 ttl pk-yrs)    Types: Cigarettes    Start date: 07/05/1969    Quit date: 07/05/2004    Years since quitting: 18.7    Passive exposure: Past   Smokeless tobacco: Never  Vaping Use   Vaping status: Never Used  Substance and Sexual Activity   Alcohol use: Yes    Alcohol/week: 3.0 standard drinks of alcohol    Types: 3 Glasses of wine per week    Comment: occ.   Drug use: Never   Sexual activity: Yes  Other Topics Concern   Not on file  Social History Narrative   Single and have adult children.    Social Determinants of Health   Financial Resource Strain: Not on file  Food Insecurity: No Food Insecurity (12/28/2022)   Hunger Vital Sign    Worried About Running Out of Food in the Last Year: Never true    Ran Out of Food in the Last Year: Never true  Transportation Needs: No Transportation Needs (12/28/2022)   PRAPARE - Administrator, Civil Service (Medical): No    Lack of Transportation (Non-Medical): No  Physical Activity: Not on file  Stress: Not on file  Social Connections: Not on file  Intimate Partner Violence: Not At Risk (12/28/2022)   Humiliation, Afraid, Rape, and Kick questionnaire    Fear of Current or Ex-Partner: No    Emotionally Abused: No    Physically Abused: No    Sexually Abused: No    Review of Systems: See HPI, otherwise negative ROS  Physical Exam: BP (!) 141/88   Temp 98.2 F (36.8 C) (Temporal)   Resp 12   Ht 5' 4.49" (1.638 m)   Wt 80.6 kg   SpO2 96%   BMI 30.04 kg/m  General:   Alert,  pleasant and  cooperative in NAD Head:  Normocephalic and atraumatic. Neck:  Supple; no masses or thyromegaly. Lungs:  Clear throughout to auscultation.    Heart:  Regular rate and rhythm. Abdomen:  Soft, nontender and nondistended. Normal bowel sounds, without guarding, and without rebound.   Neurologic:  Alert and  oriented x4;  grossly normal neurologically.  Impression/Plan: Tara Olson is here for an colonoscopy to be performed for a history of adenomatous polyps on 2017   Risks, benefits, limitations, and alternatives regarding  colonoscopy have been reviewed with the patient.  Questions have been answered.  All parties  agreeable.   [name], MD  03/28/2023, 8:13 AM

## 2023-03-28 NOTE — Transfer of Care (Signed)
Immediate Anesthesia Transfer of Care Note  Patient: Tara Olson  Procedure(s) Performed: COLONOSCOPY WITH PROPOFOL with polypectomy  Patient Location: PACU  Anesthesia Type: General  Level of Consciousness: awake, alert  and patient cooperative  Airway and Oxygen Therapy: Patient Spontanous Breathing and Patient connected to supplemental oxygen  Post-op Assessment: Post-op Vital signs reviewed, Patient's Cardiovascular Status Stable, Respiratory Function Stable, Patent Airway and No signs of Nausea or vomiting  Post-op Vital Signs: Reviewed and stable  Complications: No notable events documented.

## 2023-03-28 NOTE — Anesthesia Preprocedure Evaluation (Addendum)
Anesthesia Evaluation  Patient identified by MRN, date of birth, ID band Patient awake    Reviewed: Allergy & Precautions, H&P , NPO status , Patient's Chart, lab work & pertinent test results  Airway Mallampati: II  TM Distance: >3 FB Neck ROM: Full    Dental no notable dental hx.    Pulmonary neg pulmonary ROS, shortness of breath, sleep apnea , former smoker   Pulmonary exam normal breath sounds clear to auscultation       Cardiovascular negative cardio ROS Normal cardiovascular exam Rhythm:Regular Rate:Normal     Neuro/Psych  PSYCHIATRIC DISORDERS  Depression    negative neurological ROS  negative psych ROS   GI/Hepatic negative GI ROS, Neg liver ROS,GERD  ,,  Endo/Other  negative endocrine ROS    Renal/GU negative Renal ROS  negative genitourinary   Musculoskeletal negative musculoskeletal ROS (+) Arthritis ,    Abdominal   Peds negative pediatric ROS (+)  Hematology negative hematology ROS (+)   Anesthesia Other Findings Sleep apnea GERD (gastroesophageal reflux disease) Dyspnea Depression Arthritis HOH (hard of hearing)   Reproductive/Obstetrics negative OB ROS                              Anesthesia Physical Anesthesia Plan  ASA: 3  Anesthesia Plan: General   Post-op Pain Management:    Induction: Intravenous  PONV Risk Score and Plan:   Airway Management Planned: Natural Airway and Nasal Cannula  Additional Equipment:   Intra-op Plan:   Post-operative Plan:   Informed Consent: I have reviewed the patients History and Physical, chart, labs and discussed the procedure including the risks, benefits and alternatives for the proposed anesthesia with the patient or authorized representative who has indicated his/her understanding and acceptance.     Dental Advisory Given  Plan Discussed with: Anesthesiologist, CRNA and Surgeon  Anesthesia Plan Comments:  (Patient consented for risks of anesthesia including but not limited to:  - adverse reactions to medications - risk of airway placement if required - damage to eyes, teeth, lips or other oral mucosa - nerve damage due to positioning  - sore throat or hoarseness - Damage to heart, brain, nerves, lungs, other parts of body or loss of life  Patient voiced understanding.)        Anesthesia Quick Evaluation

## 2023-03-28 NOTE — Anesthesia Postprocedure Evaluation (Signed)
Anesthesia Post Note  Patient: Pharmacist, hospital  Procedure(s) Performed: COLONOSCOPY WITH PROPOFOL with polypectomy  Patient location during evaluation: PACU Anesthesia Type: General Level of consciousness: awake and alert Pain management: pain level controlled Vital Signs Assessment: post-procedure vital signs reviewed and stable Respiratory status: spontaneous breathing, nonlabored ventilation, respiratory function stable and patient connected to nasal cannula oxygen Cardiovascular status: blood pressure returned to baseline and stable Postop Assessment: no apparent nausea or vomiting Anesthetic complications: no   No notable events documented.   Last Vitals:  Vitals:   03/28/23 0717 03/28/23 0840  BP: (!) 141/88 103/64  Pulse:  74  Resp: 12 16  Temp: 36.8 C 36.4 C  SpO2: 96% 95%    Last Pain:  Vitals:   03/28/23 0840  TempSrc:   PainSc: 0-No pain                 Shemicka Cohrs C Rickayla Wieland

## 2023-03-29 LAB — URINE CULTURE

## 2023-03-30 ENCOUNTER — Encounter: Payer: Self-pay | Admitting: Gastroenterology

## 2023-03-30 LAB — SURGICAL PATHOLOGY

## 2023-04-11 ENCOUNTER — Ambulatory Visit (INDEPENDENT_AMBULATORY_CARE_PROVIDER_SITE_OTHER): Payer: Medicare Other | Admitting: Family Medicine

## 2023-04-11 ENCOUNTER — Encounter: Payer: Self-pay | Admitting: Family Medicine

## 2023-04-11 VITALS — BP 128/70 | HR 75 | Ht 64.49 in | Wt 179.0 lb

## 2023-04-11 DIAGNOSIS — R0609 Other forms of dyspnea: Secondary | ICD-10-CM

## 2023-04-11 DIAGNOSIS — Z23 Encounter for immunization: Secondary | ICD-10-CM | POA: Diagnosis not present

## 2023-04-11 DIAGNOSIS — R6889 Other general symptoms and signs: Secondary | ICD-10-CM | POA: Diagnosis not present

## 2023-04-11 DIAGNOSIS — I872 Venous insufficiency (chronic) (peripheral): Secondary | ICD-10-CM

## 2023-04-11 DIAGNOSIS — R202 Paresthesia of skin: Secondary | ICD-10-CM

## 2023-04-11 DIAGNOSIS — R739 Hyperglycemia, unspecified: Secondary | ICD-10-CM

## 2023-04-11 NOTE — Patient Instructions (Signed)
Peripheral Neuropathy Peripheral neuropathy is a type of nerve damage. It affects nerves that carry signals between the spinal cord and the arms, legs, and the rest of the body (peripheral nerves). It does not affect nerves in the spinal cord or brain. In peripheral neuropathy, one nerve or a group of nerves may be damaged. Peripheral neuropathy is a broad category that includes many specific nerve disorders, like diabetic neuropathy, hereditary neuropathy, and carpal tunnel syndrome. What are the causes? This condition may be caused by: Certain diseases, such as: Diabetes. This is the most common cause of peripheral neuropathy. Autoimmune diseases, such as rheumatoid arthritis and systemic lupus erythematosus. Nerve diseases that are passed from parent to child (inherited). Kidney disease. Thyroid disease. Other causes may include: Nerve injury. Pressure or stress on a nerve that lasts a long time. Lack (deficiency) of B vitamins. This can result from alcoholism, poor diet, or a restricted diet. Infections. Some medicines, such as cancer medicines (chemotherapy). Poisonous (toxic) substances, such as lead and mercury. Too little blood flowing to the legs. In some cases, the cause of this condition is not known. What are the signs or symptoms? Symptoms of this condition depend on which of your nerves is damaged. Symptoms in the legs, hands, and arms can include: Loss of feeling (numbness) in the feet, hands, or both. Tingling in the feet, hands, or both. Burning pain. Very sensitive skin. Weakness. Not being able to move a part of the body (paralysis). Clumsiness or poor coordination. Muscle twitching. Loss of balance. Symptoms in other parts of the body can include: Not being able to control your bladder. Feeling dizzy. Sexual problems. How is this diagnosed? Diagnosing and finding the cause of peripheral neuropathy can be difficult. Your health care provider will take your  medical history and do a physical exam. A neurological exam will also be done. This involves checking things that are affected by your brain, spinal cord, and nerves (nervous system). For example, your health care provider will check your reflexes, how you move, and what you can feel. You may have other tests, such as: Blood tests. Electromyogram (EMG) and nerve conduction tests. These tests check nerve function and how well the nerves are controlling the muscles. Imaging tests, such as a CT scan or MRI, to rule out other causes of your symptoms. Removing a small piece of nerve to be examined in a lab (nerve biopsy). Removing and examining a small amount of the fluid that surrounds the brain and spinal cord (lumbar puncture). How is this treated? Treatment for this condition may involve: Treating the underlying cause of the neuropathy, such as diabetes, kidney disease, or vitamin deficiencies. Stopping medicines that can cause neuropathy, such as chemotherapy. Medicine to help relieve pain. Medicines may include: Prescription or over-the-counter pain medicine. Anti-seizure medicine. Antidepressants. Pain-relieving patches that are applied to painful areas of skin. Surgery to relieve pressure on a nerve or to destroy a nerve that is causing pain. Physical therapy to help improve movement and balance. Devices to help you move around (assistive devices). Follow these instructions at home: Medicines Take over-the-counter and prescription medicines only as told by your health care provider. Do not take any other medicines without first asking your health care provider. Ask your health care provider if the medicine prescribed to you requires you to avoid driving or using machinery. Lifestyle  Do not use any products that contain nicotine or tobacco. These products include cigarettes, chewing tobacco, and vaping devices, such as e-cigarettes. Smoking keeps  blood from reaching damaged nerves. If you  need help quitting, ask your health care provider. Avoid or limit alcohol. Too much alcohol can cause a vitamin B deficiency, and vitamin B is needed for healthy nerves. Eat a healthy diet. This includes: Eating foods that are high in fiber, such as beans, whole grains, and fresh fruits and vegetables. Limiting foods that are high in fat and processed sugars, such as fried or sweet foods. General instructions  If you have diabetes, work closely with your health care provider to keep your blood sugar under control. If you have numbness in your feet: Check every day for signs of injury or infection. Watch for redness, warmth, and swelling. Wear padded socks and comfortable shoes. These help protect your feet. Develop a good support system. Living with peripheral neuropathy can be stressful. Consider talking with a mental health specialist or joining a support group. Use assistive devices and attend physical therapy as told by your health care provider. This may include using a walker or a cane. Keep all follow-up visits. This is important. Where to find more information [employer] of Neurological Disorders: [employer] Contact a health care provider if: You have new signs or symptoms of peripheral neuropathy. You are struggling emotionally from dealing with peripheral neuropathy. Your pain is not well controlled. Get help right away if: You have an injury or infection that is not healing normally. You develop new weakness in an arm or leg. You have fallen or do so frequently. Summary Peripheral neuropathy is when the nerves in the arms or legs are damaged, resulting in numbness, weakness, or pain. There are many causes of peripheral neuropathy, including diabetes, pinched nerves, vitamin deficiencies, autoimmune disease, and hereditary conditions. Diagnosing and finding the cause of peripheral neuropathy can be difficult. Your health care provider will take your medical  history, do a physical exam, and do tests, including blood tests and nerve function tests. Treatment involves treating the underlying cause of the neuropathy and taking medicines to help control pain. Physical therapy and assistive devices may also help. This information is not intended to replace advice given to you by your health care provider. Make sure you discuss any questions you have with your health care provider. Document Revised: 02/24/2021 Document Reviewed: 02/24/2021 Elsevier Patient Education  2024 [employer].

## 2023-04-11 NOTE — Progress Notes (Signed)
Date:  04/11/2023   Name:  Tara Olson   DOB:  03-29-1951   MRN:  [id number]   Chief Complaint: PAD (Patient said she had an at home screening with her insurance and tested positive for PAD.) and Hyperglycemia (Patient would like A1C because last glucose test showed 159 but she was not fasting.)  Hyperglycemia This is a new problem. The current episode started in the past 7 days. The problem occurs every several days. The problem has been waxing and waning. Associated symptoms include numbness and urinary symptoms. Pertinent negatives include no chest pain, congestion, coughing, diaphoresis, fatigue, fever, joint swelling, neck pain, sore throat, visual change or vomiting. Nothing aggravates the symptoms. She has tried nothing for the symptoms.    Lab Results  Component Value Date   NA 137 12/29/2022   K 4.6 12/29/2022   CO2 24 12/29/2022   GLUCOSE 159 (H) 12/29/2022   BUN 23 12/29/2022   CREATININE 0.79 12/29/2022   CALCIUM 8.4 (L) 12/29/2022   EGFR 73 03/30/2022   GFRNONAA >60 12/29/2022   Lab Results  Component Value Date   CHOL 289 (H) 03/30/2022   HDL 59 03/30/2022   LDLCALC 205 (H) 03/30/2022   TRIG 136 03/30/2022   No results found for: "TSH" No results found for: "HGBA1C" Lab Results  Component Value Date   WBC 10.9 (H) 12/29/2022   HGB 12.2 12/29/2022   HCT 37.5 12/29/2022   MCV 93.8 12/29/2022   PLT 214 12/29/2022   Lab Results  Component Value Date   ALT 25 12/20/2022   AST 22 12/20/2022   ALKPHOS 54 12/20/2022   BILITOT 0.7 12/20/2022   No results found for: "25OHVITD2", "25OHVITD3", "VD25OH"   Review of Systems  Constitutional:  Negative for diaphoresis, fatigue and fever.  HENT:  Negative for congestion and sore throat.   Respiratory:  Positive for shortness of breath. Negative for cough, choking, chest tightness, wheezing and stridor.   Cardiovascular:  Negative for chest pain, palpitations and leg swelling.  Gastrointestinal:  Negative for  vomiting.  Musculoskeletal:  Negative for joint swelling and neck pain.  Neurological:  Positive for numbness.    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   Primary osteoarthritis of right hip 03/31/2022   Tendinitis of upper biceps tendon of right shoulder 12/23/2021   Nontraumatic complete tear of right rotator cuff 10/28/2021   Rotator cuff tendinitis, right 10/28/2021   PVC's (premature ventricular contractions) 10/31/2020   Fibrocystic breast 08/06/2020   Closed fracture dislocation of right ankle with routine healing 07/20/2020   Former smoker 07/17/2020   Hearing loss 07/17/2020   Polyp of colon 07/17/2020   Snoring 07/17/2020   Screen for colon cancer 08/05/2015   Environmental allergies 05/19/2015    Allergies  Allergen Reactions   Hydrocodone Other (See Comments)    Hallucinations   Penicillins Swelling    Mouth swelling     Past Surgical History:  Procedure Laterality Date   BREAST BIOPSY Right 08/04/2020   Ultrasound biopsy   BREAST BIOPSY Right 05/31/2022   Right Breast distortion Ribbon Clip - path pending   BREAST BIOPSY Right 05/31/2022   MM RT BREAST BX W LOC DEV 1ST LESION IMAGE BX SPEC STEREO GUIDE 05/31/2022 [facility]   COLONOSCOPY WITH PROPOFOL N/A 03/28/2023   Procedure: COLONOSCOPY WITH PROPOFOL with  polypectomy;  Surgeon: [name], MD;  Location: [facility];  Service: Endoscopy;  Laterality: N/A;   EXPLORATORY LAPAROTOMY  1989   SHOULDER ARTHROSCOPY WITH SUBACROMIAL DECOMPRESSION, ROTATOR CUFF REPAIR AND BICEP TENDON REPAIR Right 12/22/2021   Procedure: RIGHT SHOULDER ARTHROSCOPY WITH DEBRIDEMENT, DECOMPRESSION, ROTATOR CUFF REPAIR, AND BICEPS TENODESIS;  Surgeon: Christena Flake, MD;  Location: [facility];  Service: Orthopedics;  Laterality: Right;   TOTAL HIP ARTHROPLASTY  Right 12/28/2022   Procedure: TOTAL HIP ARTHROPLASTY;  Surgeon: Christena Flake, MD;  Location: [facility];  Service: Orthopedics;  Laterality: Right;    Social History   Tobacco Use   Smoking status: Former    Current packs/day: 0.00    Average packs/day: 1 pack/day for 35.0 years (35.0 ttl pk-yrs)    Types: Cigarettes    Start date: 07/05/1969    Quit date: 07/05/2004    Years since quitting: 18.7    Passive exposure: Past   Smokeless tobacco: Never  Vaping Use   Vaping status: Never Used  Substance Use Topics   Alcohol use: Yes    Alcohol/week: 3.0 standard drinks of alcohol    Types: 3 Glasses of wine per week    Comment: occ.   Drug use: Never     Medication list has been reviewed and updated.  Current Meds  Medication Sig   acetaminophen (TYLENOL) 325 MG tablet Take 650 mg by mouth every 6 (six) hours as needed.   acetaminophen (TYLENOL) 500 MG tablet Take 2 tablets (1,000 mg total) by mouth every 6 (six) hours.       04/11/2023    1:32 PM 03/25/2023   10:59 AM 03/30/2022    9:40 AM 01/21/2022    1:38 PM  GAD 7 : Generalized Anxiety Score  Nervous, Anxious, on Edge 0 0 0 0  Control/stop worrying 0 0 0 0  Worry too much - different things 0 0 0 1  Trouble relaxing 0 0 0 0  Restless 0 0 0 0  Easily annoyed or irritable 0 0 0 0  Afraid - awful might happen 0 0 0 1  Total GAD 7 Score 0 0 0 2  Anxiety Difficulty Not difficult at all Not difficult at all Not difficult at all Not difficult at all       04/11/2023    1:32 PM 03/25/2023   10:59 AM 03/30/2022    9:40 AM  Depression screen PHQ 2/9  Decreased Interest 0 0 0  Down, Depressed, Hopeless 0 0 0  PHQ - 2 Score 0 0 0  Altered sleeping 0 0 0  Tired, decreased energy 0 0 0  Change in appetite 0 0 0  Feeling bad or failure about yourself  0 0 0  Trouble concentrating 0 0 0  Moving slowly or fidgety/restless 0 0 0  Suicidal thoughts 0 0 0  PHQ-9 Score 0 0 0  Difficult doing work/chores Not difficult at all Not  difficult at all Not difficult at all    BP Readings from Last 3 Encounters:  04/11/23 128/70  03/28/23 103/64  03/25/23 110/78    Physical Exam Vitals and nursing note reviewed. Exam conducted with a chaperone present.  Constitutional:      General: She is not in acute distress.    Appearance: She is not diaphoretic.  HENT:     Head: Normocephalic and atraumatic.     Right Ear: Tympanic membrane and external ear normal. There is no impacted cerumen.  Left Ear: Tympanic membrane and external ear normal. There is no impacted cerumen.     Nose: Nose normal.     Mouth/Throat:     Mouth: Mucous membranes are moist.  Eyes:     General:        Right eye: No discharge.        Left eye: No discharge.     Conjunctiva/sclera: Conjunctivae normal.     Pupils: Pupils are equal, round, and reactive to light.  Neck:     Thyroid: No thyromegaly.     Vascular: No JVD.  Cardiovascular:     Rate and Rhythm: Normal rate and regular rhythm.     Pulses:          Dorsalis pedis pulses are 1+ on the right side and 1+ on the left side.       Posterior tibial pulses are 1+ on the right side and 1+ on the left side.     Heart sounds: Normal heart sounds. No murmur heard.    No friction rub. No gallop.  Pulmonary:     Effort: Pulmonary effort is normal.     Breath sounds: Normal breath sounds. No wheezing, rhonchi or rales.  Abdominal:     General: Bowel sounds are normal.     Palpations: Abdomen is soft. There is no mass.     Tenderness: There is no abdominal tenderness. There is no right CVA tenderness, left CVA tenderness, guarding or rebound.  Musculoskeletal:        General: Normal range of motion.     Cervical back: Normal range of motion and neck supple.  Lymphadenopathy:     Cervical: No cervical adenopathy.  Skin:    General: Skin is warm and dry.     Findings: No bruising, erythema or lesion.  Neurological:     Mental Status: She is alert.     Wt Readings from Last 3  Encounters:  04/11/23 179 lb (81.2 kg)  03/28/23 177 lb 11.2 oz (80.6 kg)  03/25/23 188 lb (85.3 kg)    BP 128/70   Pulse 75   Ht 5' 4.49" (1.638 m)   Wt 179 lb (81.2 kg)   SpO2 96%   BMI 30.26 kg/m   Assessment and Plan:  1. Abnormal ankle brachial index New onset.  Asymptomatic.  Patient had screening nurses to come to her house from [facility] for evaluation where they did AKI and noted that the left was a 0.8 to in the right was 0.62.  I was able to palpate dorsalis pedis pulses but they were decreased.  We will refer for vascular surgery to reevaluate. - Ambulatory referral to Vascular Surgery  2. Venous insufficiency Patient had swelling in her feet particularly after a 3-hour airplane ride from [place].  I have suggested that she elevate her legs more and we will have officially evaluation of her venous system by a VVS and perhaps prescription compression stockings.  3. Paresthesia of foot, bilateral Chronic.  Persistent.  Patient has paresthesias of the feet and has been given information on peripheral neuropathy we will begin with looking at evaluating for diabetes and circulation first and then if normal will proceed with further evaluation.  4. Hyperglycemia Has had elevated glucose readings but it is uncertain if these are fasting.  These were and the mid 130 range we will check A1c for this. - Hemoglobin A1c  5. DOE (dyspnea on exertion) New problem that was brought up concerning patient's  breathing particularly with exertion.  Patient will return for reevaluation and will make appointment for evaluation both for cardiac as well as pulmonic possibilities for symptomatology.  6. Need for influenza vaccination Discussed and administered - Flu Vaccine Trivalent High Dose (Fluad)    Elizabeth Sauer, MD

## 2023-04-12 ENCOUNTER — Encounter: Payer: Self-pay | Admitting: Family Medicine

## 2023-04-12 LAB — HEMOGLOBIN A1C
Est. average glucose Bld gHb Est-mCnc: 114 mg/dL
Hgb A1c MFr Bld: 5.6 % (ref 4.8–5.6)

## 2023-04-25 ENCOUNTER — Ambulatory Visit: Payer: Medicare Other | Admitting: Family Medicine

## 2023-05-11 DIAGNOSIS — Z885 Allergy status to narcotic agent status: Secondary | ICD-10-CM | POA: Diagnosis not present

## 2023-05-11 DIAGNOSIS — J90 Pleural effusion, not elsewhere classified: Secondary | ICD-10-CM | POA: Diagnosis not present

## 2023-05-11 DIAGNOSIS — R079 Chest pain, unspecified: Secondary | ICD-10-CM | POA: Diagnosis not present

## 2023-05-11 DIAGNOSIS — R110 Nausea: Secondary | ICD-10-CM | POA: Diagnosis not present

## 2023-05-11 DIAGNOSIS — R030 Elevated blood-pressure reading, without diagnosis of hypertension: Secondary | ICD-10-CM | POA: Diagnosis not present

## 2023-05-11 DIAGNOSIS — R1013 Epigastric pain: Secondary | ICD-10-CM | POA: Diagnosis not present

## 2023-05-11 DIAGNOSIS — R109 Unspecified abdominal pain: Secondary | ICD-10-CM | POA: Diagnosis not present

## 2023-05-11 DIAGNOSIS — Z880 Allergy status to penicillin: Secondary | ICD-10-CM | POA: Diagnosis not present

## 2023-05-11 DIAGNOSIS — J9811 Atelectasis: Secondary | ICD-10-CM | POA: Diagnosis not present

## 2023-05-11 DIAGNOSIS — M549 Dorsalgia, unspecified: Secondary | ICD-10-CM | POA: Diagnosis not present

## 2023-05-12 DIAGNOSIS — R1013 Epigastric pain: Secondary | ICD-10-CM | POA: Diagnosis not present

## 2023-05-12 DIAGNOSIS — M549 Dorsalgia, unspecified: Secondary | ICD-10-CM | POA: Diagnosis not present

## 2023-05-12 DIAGNOSIS — R079 Chest pain, unspecified: Secondary | ICD-10-CM | POA: Diagnosis not present

## 2023-05-14 DIAGNOSIS — R109 Unspecified abdominal pain: Secondary | ICD-10-CM | POA: Diagnosis not present

## 2023-05-16 ENCOUNTER — Other Ambulatory Visit (INDEPENDENT_AMBULATORY_CARE_PROVIDER_SITE_OTHER): Payer: Self-pay | Admitting: Nurse Practitioner

## 2023-05-16 DIAGNOSIS — R0989 Other specified symptoms and signs involving the circulatory and respiratory systems: Secondary | ICD-10-CM

## 2023-05-16 DIAGNOSIS — R6889 Other general symptoms and signs: Secondary | ICD-10-CM

## 2023-05-19 ENCOUNTER — Ambulatory Visit (INDEPENDENT_AMBULATORY_CARE_PROVIDER_SITE_OTHER): Payer: Medicare Other | Admitting: Nurse Practitioner

## 2023-05-19 ENCOUNTER — Ambulatory Visit (INDEPENDENT_AMBULATORY_CARE_PROVIDER_SITE_OTHER): Payer: Medicare Other

## 2023-05-19 ENCOUNTER — Encounter (INDEPENDENT_AMBULATORY_CARE_PROVIDER_SITE_OTHER): Payer: Self-pay | Admitting: Nurse Practitioner

## 2023-05-19 VITALS — BP 136/82 | HR 76 | Resp 18 | Ht 64.5 in | Wt 181.6 lb

## 2023-05-19 DIAGNOSIS — I739 Peripheral vascular disease, unspecified: Secondary | ICD-10-CM

## 2023-05-19 DIAGNOSIS — R0989 Other specified symptoms and signs involving the circulatory and respiratory systems: Secondary | ICD-10-CM | POA: Diagnosis not present

## 2023-05-19 DIAGNOSIS — R6889 Other general symptoms and signs: Secondary | ICD-10-CM | POA: Diagnosis not present

## 2023-05-19 LAB — VAS US ABI WITH/WO TBI
Left ABI: 0.76
Right ABI: 0.72

## 2023-05-21 IMAGING — MR MR SHOULDER*R* W/O CM
5 series · 40 of 40 positions shown · non-contrast
Comparison: None.

CLINICAL DATA: Right shoulder pain for 1 year

EXAM:
MRI OF THE RIGHT SHOULDER WITHOUT CONTRAST
TECHNIQUE: Multiplanar, multisequence MR imaging of the shoulder was performed.
No intravenous contrast was administered.

[Series 5: T2 fat-sat · axial · 4.0mm · 0.55mm/px · z∈[-55,+51]mm · 8 of 25 slices shown (1 of 3)]
[im 1/25]
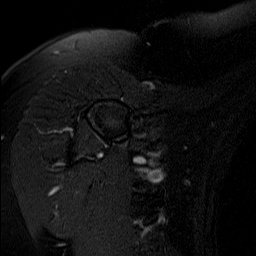
[im 4/25]
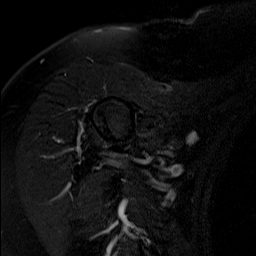
[im 7/25]
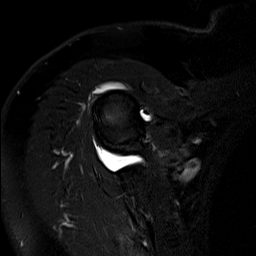
[im 11/25]
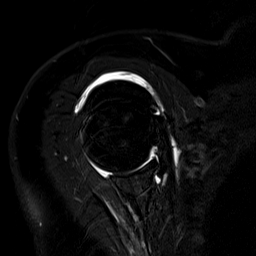
[im 14/25]
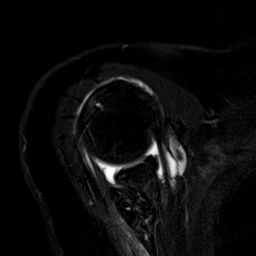
[im 18/25]
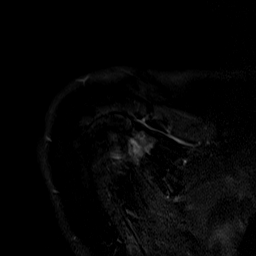
[im 21/25]
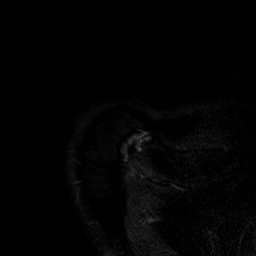
[im 25/25]
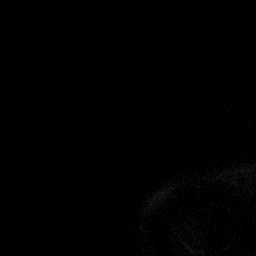

[Series 6: T2 fat-sat · oblique · 4.0mm · 0.59mm/px · 8 of 25 slices shown (2 of 3)]
[im 1/25]
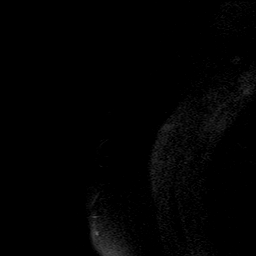
[im 4/25]
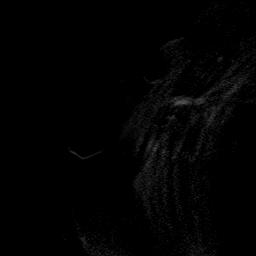
[im 7/25]
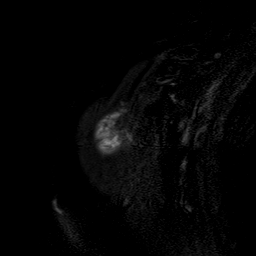
[im 11/25]
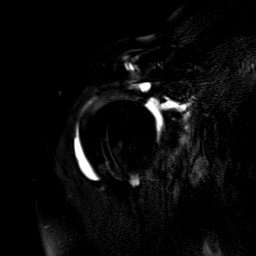
[im 14/25]
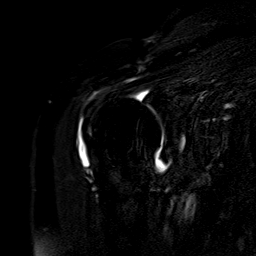
[im 18/25]
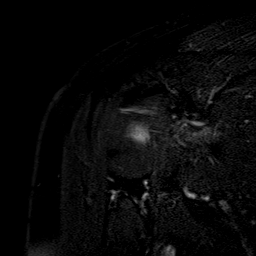
[im 21/25]
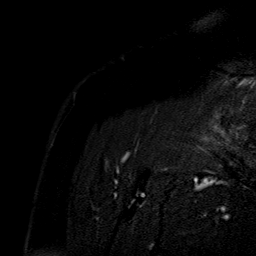
[im 25/25]
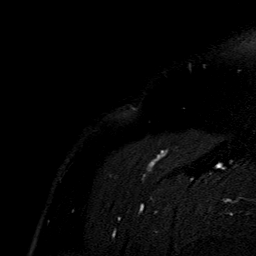

[Series 7: T1 · sagittal · 4.0mm · 0.59mm/px · 8 of 25 slices shown]
[im 1/25]
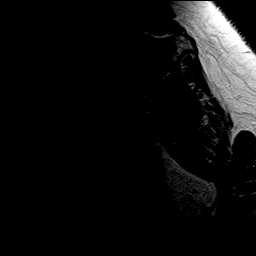
[im 4/25]
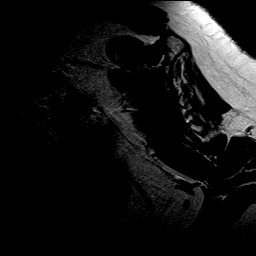
[im 7/25]
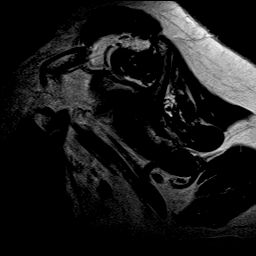
[im 11/25]
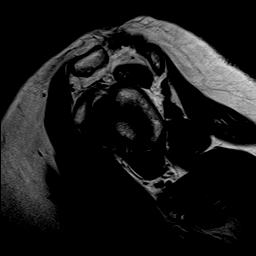
[im 14/25]
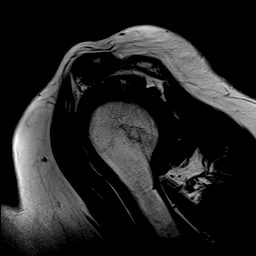
[im 18/25]
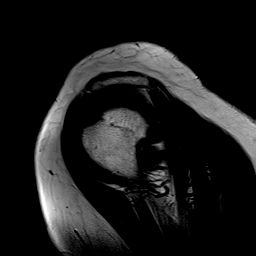
[im 21/25]
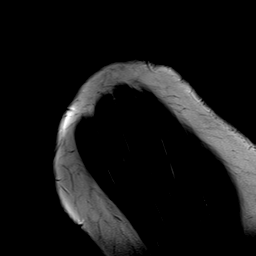
[im 25/25]
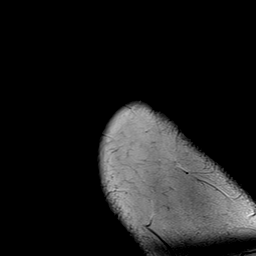

[Series 8: PD · oblique · 4.0mm · 0.55mm/px · 8 of 25 slices shown]
[im 1/25]
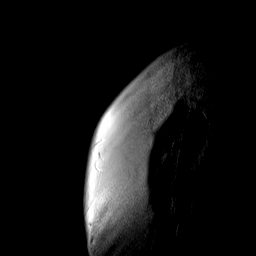
[im 4/25]
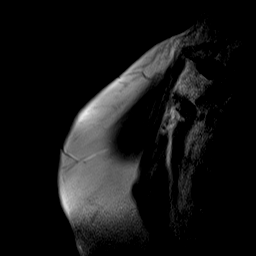
[im 7/25]
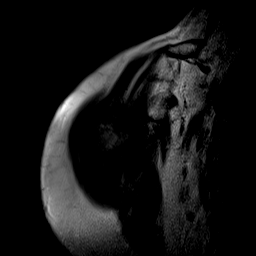
[im 11/25]
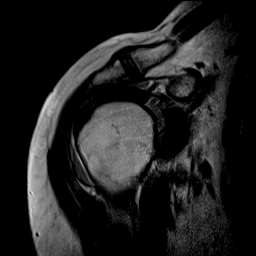
[im 14/25]
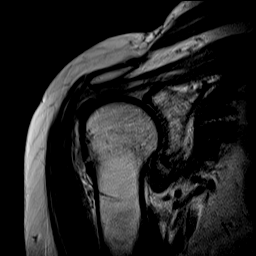
[im 18/25]
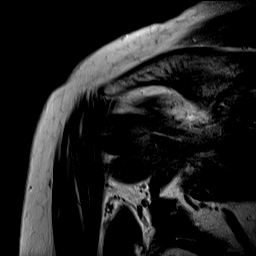
[im 21/25]
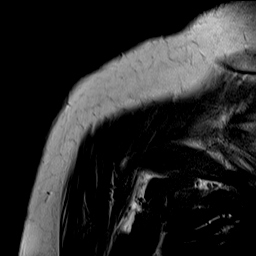
[im 25/25]
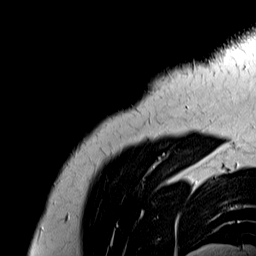

[Series 9: T2 fat-sat · sagittal · 4.0mm · 0.59mm/px · 8 of 25 slices shown (3 of 3)]
[im 1/25]
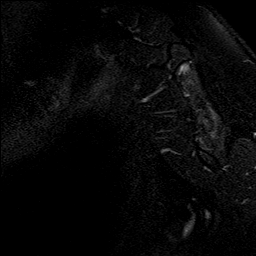
[im 4/25]
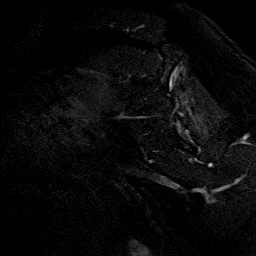
[im 7/25]
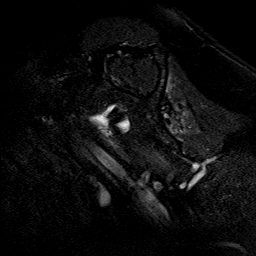
[im 11/25]
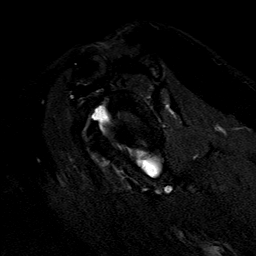
[im 14/25]
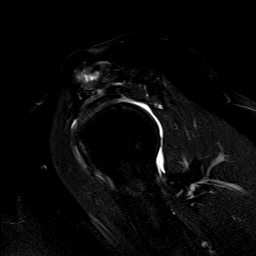
[im 18/25]
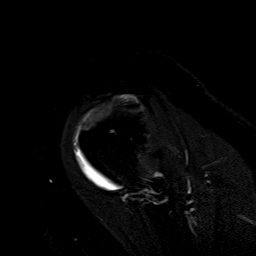
[im 21/25]
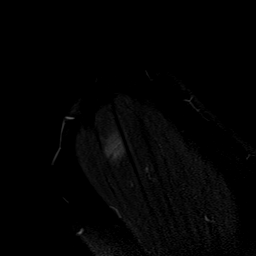
[im 25/25]
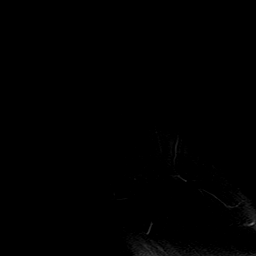

[40 of 40 positions shown; findings below may reference images not displayed]

FINDINGS: Despite efforts by the technologist and patient, motion artifact is
present on today's exam and could not be eliminated. This reduces
exam sensitivity and specificity.

Rotator cuff: Full-thickness partial width tear of the distal
anterior supraspinatus tendon as on image 16 series 9 and image 18
series 6.

Muscles: Abnormal edema and atrophy in the infraspinatus muscle
along with some fluid tracking along the myotendinous junction of
the infraspinatus muscle.

Biceps long head:  Grossly intact

Acromioclavicular Joint: Mild to moderate spurring with mild
subcortical marrow edema and a small amount of fluid signal within
the joint. Type II acromion. As expected given the rotator cuff
tear, there is fluid in the subacromial subdeltoid bursa. Trace
synovitis in the bursa.

Glenohumeral Joint: Moderate degenerative chondral thinning in the
glenohumeral joint. Moderate glenohumeral joint effusion
communicates with the subacromial subdeltoid bursa.

Labrum: Truncated posterior labrum on images 13-14 of series 5,
suspicious for a labral tear.

Bones: No significant extra-articular osseous abnormalities
identified.

Other: No supplemental non-categorized findings.
IMPRESSION: 1. Full-thickness partial width tear of the distal anterior
supraspinatus tendon.
2. Abnormal edema and atrophy in the infraspinatus muscle
potentially the result of prior injury or possibly brachial
neuritis.
3. Moderate degenerative AC joint arthropathy and moderate
degenerative glenohumeral arthropathy.
4. Moderate glenohumeral joint effusion communicates with the
subacromial subdeltoid bursa. Trace synovitis along the bursa.
5. Truncated posterior labrum suspicious for labral tear.

## 2023-05-30 NOTE — Progress Notes (Signed)
Subjective:    Patient ID: Tara Olson, female    DOB: 25-Apr-1951, 72 y.o.   MRN: [id number] Chief Complaint  Patient presents with   New Patient (Initial Visit)    np. ABI + consult. home health screening where they did AKI and noted that the left was a 0.8 to in the right was 0.62. I was able to palpate dorsalis pedis pulses but they were decreased. jones, deanna.    The patient is a 72 year old female who presents today as a referral from her primary care provider regarding abnormal ABIs.  These were done during a home screening.  She notes that she does not have any claudication symptoms, rest pain or ulcerations.  Today her noninvasive study showed right ABI 0.72 on the left and 0.76.  Her home health study showed an ABI 0.61 on the right and 0.80 on the left.  She has monophasic/biphasic waveforms bilaterally with slightly dampened toe waveforms bilaterally.    Review of Systems  All other systems reviewed and are negative.      Objective:   Physical Exam Vitals reviewed.  HENT:     Head: Normocephalic.  Cardiovascular:     Rate and Rhythm: Normal rate.     Pulses:          Dorsalis pedis pulses are detected w/ Doppler on the right side and detected w/ Doppler on the left side.       Posterior tibial pulses are detected w/ Doppler on the right side and detected w/ Doppler on the left side.  Pulmonary:     Effort: Pulmonary effort is normal.  Skin:    General: Skin is warm and dry.  Neurological:     Mental Status: She is alert and oriented to person, place, and time.  Psychiatric:        Mood and Affect: Mood normal.        Behavior: Behavior normal.        Thought Content: Thought content normal.        Judgment: Judgment normal.     BP 136/82 (BP Location: Right Arm)   Pulse 76   Resp 18   Ht 5' 4.5" (1.638 m)   Wt 181 lb 9.6 oz (82.4 kg)   BMI 30.69 kg/m   Past Medical History:  Diagnosis Date   Arthritis    joints, hands   Depression    Dyspnea     GERD (gastroesophageal reflux disease)    HOH (hard of hearing)    Wears aides bilateral   Sleep apnea    Uses CPAP    Social History   Socioeconomic History   Marital status: Single    Spouse name: Not on file   Number of children: Not on file   Years of education: Not on file   Highest education level: Not on file  Occupational History   Not on file  Tobacco Use   Smoking status: Former    Current packs/day: 0.00    Average packs/day: 1 pack/day for 35.0 years (35.0 ttl pk-yrs)    Types: Cigarettes    Start date: 07/05/1969    Quit date: 07/05/2004    Years since quitting: 18.9    Passive exposure: Past   Smokeless tobacco: Never  Vaping Use   Vaping status: Never Used  Substance and Sexual Activity   Alcohol use: Yes    Alcohol/week: 3.0 standard drinks of alcohol    Types: 3 Glasses of wine per week  Comment: occ.   Drug use: Never   Sexual activity: Yes  Other Topics Concern   Not on file  Social History Narrative   Single and have adult children.    Social Determinants of Health   Financial Resource Strain: Not on file  Food Insecurity: No Food Insecurity (12/28/2022)   Hunger Vital Sign    Worried About Running Out of Food in the Last Year: Never true    Ran Out of Food in the Last Year: Never true  Transportation Needs: No Transportation Needs (12/28/2022)   PRAPARE - Administrator, Civil Service (Medical): No    Lack of Transportation (Non-Medical): No  Physical Activity: Not on file  Stress: Not on file  Social Connections: Not on file  Intimate Partner Violence: Not At Risk (12/28/2022)   Humiliation, Afraid, Rape, and Kick questionnaire    Fear of Current or Ex-Partner: No    Emotionally Abused: No    Physically Abused: No    Sexually Abused: No    Past Surgical History:  Procedure Laterality Date   BREAST BIOPSY Right 08/04/2020   Ultrasound biopsy   BREAST BIOPSY Right 05/31/2022   Right Breast distortion Ribbon Clip - path  pending   BREAST BIOPSY Right 05/31/2022   MM RT BREAST BX W LOC DEV 1ST LESION IMAGE BX SPEC STEREO GUIDE 05/31/2022 [facility]   COLONOSCOPY WITH PROPOFOL N/A 03/28/2023   Procedure: COLONOSCOPY WITH PROPOFOL with polypectomy;  Surgeon: [name], MD;  Location: [facility];  Service: Endoscopy;  Laterality: N/A;   EXPLORATORY LAPAROTOMY  1989   SHOULDER ARTHROSCOPY WITH SUBACROMIAL DECOMPRESSION, ROTATOR CUFF REPAIR AND BICEP TENDON REPAIR Right 12/22/2021   Procedure: RIGHT SHOULDER ARTHROSCOPY WITH DEBRIDEMENT, DECOMPRESSION, ROTATOR CUFF REPAIR, AND BICEPS TENODESIS;  Surgeon: Christena Flake, MD;  Location: [facility];  Service: Orthopedics;  Laterality: Right;   TOTAL HIP ARTHROPLASTY Right 12/28/2022   Procedure: TOTAL HIP ARTHROPLASTY;  Surgeon: Christena Flake, MD;  Location: [facility];  Service: Orthopedics;  Laterality: Right;    Family History  Problem Relation Age of Onset   Colon cancer Mother    Diabetes Father    Hypertension Father    Heart disease Father    Heart disease Brother    Breast cancer Maternal Aunt     Allergies  Allergen Reactions   Hydrocodone Other (See Comments)    Hallucinations   Penicillins Swelling    Mouth swelling        Latest Ref Rng & Units 12/29/2022    6:02 AM 12/20/2022    2:26 PM 12/16/2021   11:23 AM  CBC  WBC 4.0 - 10.5 K/uL 10.9  6.1  7.0   Hemoglobin 12.0 - 15.0 g/dL 29.5  28.4  13.2   Hematocrit 36.0 - 46.0 % 37.5  42.9  42.8   Platelets 150 - 400 K/uL 214  223  261       CMP     Component Value Date/Time   NA 137 12/29/2022 0602   NA 137 03/30/2022 1037   K 4.6 12/29/2022 0602   CL 105 12/29/2022 0602   CO2 24 12/29/2022 0602   GLUCOSE 159 (H) 12/29/2022 0602   BUN 23 12/29/2022 0602   BUN 17 03/30/2022 1037   CREATININE 0.79 12/29/2022 0602   CALCIUM 8.4 (L) 12/29/2022 0602   PROT 7.3 12/20/2022 1426   PROT 7.1 03/30/2022 1037   ALBUMIN 4.3 12/20/2022 1426   ALBUMIN 4.5 03/30/2022 1037  AST 22  12/20/2022 1426   ALT 25 12/20/2022 1426   ALKPHOS 54 12/20/2022 1426   BILITOT 0.7 12/20/2022 1426   BILITOT 0.4 03/30/2022 1037   EGFR 73 03/30/2022 1037   GFRNONAA >60 12/29/2022 0602     VAS [place] ABI WITH/WO TBI  Result Date: 05/19/2023  LOWER EXTREMITY DOPPLER STUDY Patient Name:  Tara Olson  Date of Exam:   05/19/2023 Medical Rec #: [id number]         Accession #:    [id number] Date of Birth: 1951/01/18          Patient Gender: F Patient Age:   89 years Exam Location:  [facility] Procedure:      VAS [place] ABI WITH/WO TBI Referring Phys: Levora Dredge --------------------------------------------------------------------------------  Indications: Abnormal Home Health ABI screening  Comparison Study: Rt .62, Lt .80 On Previous Home Health screening                   (04/11/2023). Performing Technologist: Debbe Bales RVS  Examination Guidelines: A complete evaluation includes at minimum, Doppler waveform signals and systolic blood pressure reading at the level of bilateral brachial, anterior tibial, and posterior tibial arteries, when vessel segments are accessible. Bilateral testing is considered an integral part of a complete examination. Photoelectric Plethysmograph (PPG) waveforms and toe systolic pressure readings are included as required and additional duplex testing as needed. Limited examinations for reoccurring indications may be performed as noted.  ABI Findings: +---------+------------------+-----+----------+--------+ Right    Rt Pressure (mmHg)IndexWaveform  Comment  +---------+------------------+-----+----------+--------+ Brachial 148                                       +---------+------------------+-----+----------+--------+ ATA      83                0.56 monophasic         +---------+------------------+-----+----------+--------+ PTA      106               0.72 biphasic           +---------+------------------+-----+----------+--------+ Great  Toe60                0.41 Abnormal           +---------+------------------+-----+----------+--------+ +---------+------------------+-----+----------+-------+ Left     Lt Pressure (mmHg)IndexWaveform  Comment +---------+------------------+-----+----------+-------+ Brachial 142                                      +---------+------------------+-----+----------+-------+ ATA      88                0.59 monophasic        +---------+------------------+-----+----------+-------+ PTA      112               0.76 biphasic          +---------+------------------+-----+----------+-------+ Great Toe96                0.65 Abnormal          +---------+------------------+-----+----------+-------+ +-------+-----------+-----------+------------+------------+ ABI/TBIToday's ABIToday's TBIPrevious ABIPrevious TBI +-------+-----------+-----------+------------+------------+ Right  .72        .41        .62                      +-------+-----------+-----------+------------+------------+ Left   .  76        .65        .80                      +-------+-----------+-----------+------------+------------+ Right ABIs appear increased compared to prior study on 04/11/2023. Left ABIs appear decreased compared to prior study on 04/11/2023.  Summary: Right: Resting right ankle-brachial index indicates moderate right lower extremity arterial disease. The right toe-brachial index is abnormal. Left: Resting left ankle-brachial index indicates moderate left lower extremity arterial disease. The left toe-brachial index is abnormal. *See table(s) above for measurements and observations.  Electronically signed by Levora Dredge MD on 05/19/2023 at 3:32:40 PM.    Final        Assessment & Plan:   1. PAD (peripheral artery disease) (HCC) I had a long discussion with the patient regarding peripheral arterial disease as it relates to symptoms and need for intervention.  At this time the patient notes  that she does not have any significant claudication symptoms, rest pain or ulcerations that are nonhealing.  Because of this and certainly reasonable not to move forward with intervention but rather to move forward with conservative therapies.  In this instance the addition of aspirin is recommended.  She is also advised to walk as this can help with collateralization thus decreasing peripheral arterial disease symptoms.  Her lipid levels when last evaluated were high we recommended a low-dose statin however the patient has had severe allergic reactions to statins.  Based on this it is recommended she discuss options with her PCP.   Current Outpatient Medications on File Prior to Visit  Medication Sig Dispense Refill   acetaminophen (TYLENOL) 325 MG tablet Take 650 mg by mouth every 6 (six) hours as needed.     acetaminophen (TYLENOL) 500 MG tablet Take 2 tablets (1,000 mg total) by mouth every 6 (six) hours. 60 tablet 0   Biotin 5000 MCG CAPS Take by mouth.     Calcium Carbonate-Vit D-Min (CALCIUM 1200 PO) Take by mouth.     cyanocobalamin (VITAMIN B12) 1000 MCG tablet Take 1,000 mcg by mouth daily.     Multiple Vitamin (MULTIVITAMIN) capsule Take 1 capsule by mouth daily.     Na Sulfate-K Sulfate-Mg Sulf 17.5-3.13-1.6 GM/177ML SOLN MIX AND DRINK AS DIRECTED     No current facility-administered medications on file prior to visit.    There are no Patient Instructions on file for this visit. No follow-ups on file.   Georgiana Spinner, NP

## 2023-06-15 DIAGNOSIS — Z96641 Presence of right artificial hip joint: Secondary | ICD-10-CM | POA: Diagnosis not present

## 2023-06-15 DIAGNOSIS — M1611 Unilateral primary osteoarthritis, right hip: Secondary | ICD-10-CM | POA: Diagnosis not present

## 2023-07-19 ENCOUNTER — Other Ambulatory Visit: Payer: Self-pay | Admitting: Family Medicine

## 2023-07-19 DIAGNOSIS — J4520 Mild intermittent asthma, uncomplicated: Secondary | ICD-10-CM

## 2023-11-18 ENCOUNTER — Other Ambulatory Visit (INDEPENDENT_AMBULATORY_CARE_PROVIDER_SITE_OTHER): Payer: Self-pay | Admitting: Nurse Practitioner

## 2023-11-18 DIAGNOSIS — I739 Peripheral vascular disease, unspecified: Secondary | ICD-10-CM

## 2023-11-21 ENCOUNTER — Ambulatory Visit (INDEPENDENT_AMBULATORY_CARE_PROVIDER_SITE_OTHER): Payer: Medicare Other

## 2023-11-21 ENCOUNTER — Encounter (INDEPENDENT_AMBULATORY_CARE_PROVIDER_SITE_OTHER): Payer: Self-pay | Admitting: Vascular Surgery

## 2023-11-21 ENCOUNTER — Ambulatory Visit (INDEPENDENT_AMBULATORY_CARE_PROVIDER_SITE_OTHER): Payer: Medicare Other | Admitting: Vascular Surgery

## 2023-11-21 VITALS — BP 149/86 | HR 69 | Resp 18 | Ht 64.5 in | Wt 183.4 lb

## 2023-11-21 DIAGNOSIS — I739 Peripheral vascular disease, unspecified: Secondary | ICD-10-CM

## 2023-11-21 DIAGNOSIS — J439 Emphysema, unspecified: Secondary | ICD-10-CM

## 2023-11-21 DIAGNOSIS — I70213 Atherosclerosis of native arteries of extremities with intermittent claudication, bilateral legs: Secondary | ICD-10-CM

## 2023-11-21 LAB — VAS US ABI WITH/WO TBI
Left ABI: 0.7
Right ABI: 0.75

## 2023-11-22 ENCOUNTER — Encounter (INDEPENDENT_AMBULATORY_CARE_PROVIDER_SITE_OTHER): Payer: Self-pay

## 2023-11-24 ENCOUNTER — Encounter: Payer: Self-pay | Admitting: Family Medicine

## 2023-11-24 ENCOUNTER — Ambulatory Visit (INDEPENDENT_AMBULATORY_CARE_PROVIDER_SITE_OTHER): Admitting: Family Medicine

## 2023-11-24 VITALS — BP 124/82 | HR 77 | Ht 64.5 in | Wt 181.4 lb

## 2023-11-24 DIAGNOSIS — E7800 Pure hypercholesterolemia, unspecified: Secondary | ICD-10-CM

## 2023-11-24 DIAGNOSIS — Z6830 Body mass index (BMI) 30.0-30.9, adult: Secondary | ICD-10-CM | POA: Diagnosis not present

## 2023-11-24 DIAGNOSIS — I739 Peripheral vascular disease, unspecified: Secondary | ICD-10-CM

## 2023-11-24 NOTE — Patient Instructions (Signed)

## 2023-11-24 NOTE — Progress Notes (Signed)
 Date:  11/24/2023   Name:  Tara Olson   DOB:  1950/10/06   MRN:  [id number]   Chief Complaint: PVD (F/UP, seen vascular yesterday )  Hyperlipidemia This is a chronic problem. The current episode started more than 1 year ago. The problem is controlled. Recent lipid tests were reviewed and are normal. She has no history of chronic renal disease, diabetes, hypothyroidism, liver disease, obesity or nephrotic syndrome. There are no known factors aggravating her hyperlipidemia. Associated symptoms include chest pain and shortness of breath. Pertinent negatives include no focal sensory loss, focal weakness, leg pain or myalgias. She is currently on no antihyperlipidemic treatment.  Shortness of Breath This is a chronic problem. The current episode started more than 1 year ago. The problem has been waxing and waning. Associated symptoms include chest pain. Pertinent negatives include no abdominal pain, ear pain, fever, headaches, leg pain, leg swelling, orthopnea, PND, rash, rhinorrhea, sore throat or wheezing. The symptoms are aggravated by exercise.    Lab Results  Component Value Date   NA 137 12/29/2022   K 4.6 12/29/2022   CO2 24 12/29/2022   GLUCOSE 159 (H) 12/29/2022   BUN 23 12/29/2022   CREATININE 0.79 12/29/2022   CALCIUM 8.4 (L) 12/29/2022   EGFR 73 03/30/2022   GFRNONAA >60 12/29/2022   Lab Results  Component Value Date   CHOL 289 (H) 03/30/2022   HDL 59 03/30/2022   LDLCALC 205 (H) 03/30/2022   TRIG 136 03/30/2022   No results found for: "TSH" Lab Results  Component Value Date   HGBA1C 5.6 04/11/2023   Lab Results  Component Value Date   WBC 10.9 (H) 12/29/2022   HGB 12.2 12/29/2022   HCT 37.5 12/29/2022   MCV 93.8 12/29/2022   PLT 214 12/29/2022   Lab Results  Component Value Date   ALT 25 12/20/2022   AST 22 12/20/2022   ALKPHOS 54 12/20/2022   BILITOT 0.7 12/20/2022   No results found for: "25OHVITD2", "25OHVITD3", "VD25OH"   Review of Systems   Constitutional: Negative.  Negative for chills, fatigue, fever and unexpected weight change.  HENT:  Negative for congestion, ear discharge, ear pain, rhinorrhea, sinus pressure, sneezing and sore throat.   Respiratory:  Positive for shortness of breath. Negative for cough, wheezing and stridor.   Cardiovascular:  Positive for chest pain. Negative for palpitations, orthopnea, leg swelling and PND.  Gastrointestinal:  Negative for abdominal pain, blood in stool, constipation, diarrhea and nausea.  Genitourinary:  Negative for dysuria, flank pain, frequency, hematuria, urgency and vaginal discharge.  Musculoskeletal:  Negative for arthralgias, back pain and myalgias.  Skin:  Negative for rash.  Neurological:  Negative for dizziness, focal weakness, weakness and headaches.  Hematological:  Negative for adenopathy. Does not bruise/bleed easily.  Psychiatric/Behavioral:  Negative for dysphoric mood. The patient is not nervous/anxious.     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   Primary osteoarthritis of right hip 03/31/2022   Tendinitis of upper biceps tendon of right shoulder 12/23/2021   Nontraumatic complete tear of right rotator cuff 10/28/2021   Rotator cuff tendinitis, right 10/28/2021   PVC's (premature ventricular contractions) 10/31/2020   Fibrocystic breast 08/06/2020   Closed fracture dislocation of right ankle with routine healing 07/20/2020   Former smoker 07/17/2020   Hearing loss 07/17/2020   Polyp  of colon 07/17/2020   Snoring 07/17/2020   Screen for colon cancer 08/05/2015   Environmental allergies 05/19/2015    Allergies  Allergen Reactions   Hydrocodone Other (See Comments)    Hallucinations   Penicillins Swelling    Mouth swelling     Past Surgical History:  Procedure Laterality Date   BREAST BIOPSY  Right 08/04/2020   Ultrasound biopsy   BREAST BIOPSY Right 05/31/2022   Right Breast distortion Ribbon Clip - path pending   BREAST BIOPSY Right 05/31/2022   MM RT BREAST BX W LOC DEV 1ST LESION IMAGE BX SPEC STEREO GUIDE 05/31/2022 [facility]   COLONOSCOPY WITH PROPOFOL  N/A 03/28/2023   Procedure: COLONOSCOPY WITH PROPOFOL  with polypectomy;  Surgeon: Marnee Sink, MD;  Location: [facility];  Service: Endoscopy;  Laterality: N/A;   EXPLORATORY LAPAROTOMY  1989   SHOULDER ARTHROSCOPY WITH SUBACROMIAL DECOMPRESSION, ROTATOR CUFF REPAIR AND BICEP TENDON REPAIR Right 12/22/2021   Procedure: RIGHT SHOULDER ARTHROSCOPY WITH DEBRIDEMENT, DECOMPRESSION, ROTATOR CUFF REPAIR, AND BICEPS TENODESIS;  Surgeon: Elner Hahn, MD;  Location: [facility];  Service: Orthopedics;  Laterality: Right;   TOTAL HIP ARTHROPLASTY Right 12/28/2022   Procedure: TOTAL HIP ARTHROPLASTY;  Surgeon: Elner Hahn, MD;  Location: [facility];  Service: Orthopedics;  Laterality: Right;    Social History   Tobacco Use   Smoking status: Former    Current packs/day: 0.00    Average packs/day: 1 pack/day for 35.0 years (35.0 ttl pk-yrs)    Types: Cigarettes    Start date: 07/05/1969    Quit date: 07/05/2004    Years since quitting: 19.4    Passive exposure: Past   Smokeless tobacco: Never  Vaping Use   Vaping status: Never Used  Substance Use Topics   Alcohol use: Yes    Alcohol/week: 3.0 standard drinks of alcohol    Types: 3 Glasses of wine per week    Comment: occ.   Drug use: Never     Medication list has been reviewed and updated.  Current Meds  Medication Sig   acetaminophen  (TYLENOL ) 325 MG tablet Take 650 mg by mouth every 6 (six) hours as needed.   albuterol  (VENTOLIN  HFA) 108 (90 Base) MCG/ACT inhaler INHALE 2 PUFFS INTO THE LUNGS EVERY 4 HOURS AS NEEDED   Biotin 5000 MCG CAPS Take by mouth.   Calcium Carbonate-Vit D-Min (CALCIUM 1200 PO) Take by mouth.   cyanocobalamin (VITAMIN B12) 1000 MCG  tablet Take 1,000 mcg by mouth daily.   GARLIC PO Take by mouth. supplement   Misc Natural Products (BEET ROOT PO) Take by mouth. Chewable gummies   Multiple Vitamin (MULTIVITAMIN) capsule Take 1 capsule by mouth daily.   Na Sulfate-K Sulfate-Mg Sulf 17.5-3.13-1.6 GM/177ML SOLN MIX AND DRINK AS DIRECTED       11/24/2023    9:06 AM 04/11/2023    1:32 PM 03/25/2023   10:59 AM 03/30/2022    9:40 AM  GAD 7 : Generalized Anxiety Score  Nervous, Anxious, on Edge 0 0 0 0  Control/stop worrying 0 0 0 0  Worry too much - different things 0 0 0 0  Trouble relaxing 0 0 0 0  Restless 0 0 0 0  Easily annoyed or irritable 0 0 0 0  Afraid - awful might happen 0 0 0 0  Total GAD 7 Score 0 0 0 0  Anxiety Difficulty Not difficult at all Not difficult at all Not difficult at all Not difficult at all  11/24/2023    9:06 AM 04/11/2023    1:32 PM 03/25/2023   10:59 AM  Depression screen PHQ 2/9  Decreased Interest 1 0 0  Down, Depressed, Hopeless 1 0 0  PHQ - 2 Score 2 0 0  Altered sleeping 0 0 0  Tired, decreased energy 3 0 0  Change in appetite 3 0 0  Feeling bad or failure about yourself  0 0 0  Trouble concentrating 0 0 0  Moving slowly or fidgety/restless 0 0 0  Suicidal thoughts 0 0 0  PHQ-9 Score 8 0 0  Difficult doing work/chores Somewhat difficult Not difficult at all Not difficult at all    BP Readings from Last 3 Encounters:  11/24/23 124/82  11/21/23 (!) 149/86  05/19/23 136/82    Physical Exam Vitals and nursing note reviewed.  Constitutional:      General: She is not in acute distress.    Appearance: She is not diaphoretic.  HENT:     Head: Normocephalic and atraumatic.     Right Ear: External ear normal.     Left Ear: External ear normal.     Nose: Nose normal.  Eyes:     General:        Right eye: No discharge.        Left eye: No discharge.     Conjunctiva/sclera: Conjunctivae normal.     Pupils: Pupils are equal, round, and reactive to light.  Neck:      Thyroid: No thyromegaly.     Vascular: No JVD.  Cardiovascular:     Rate and Rhythm: Normal rate and regular rhythm.     Heart sounds: Normal heart sounds. No murmur heard.    No friction rub. No gallop.  Pulmonary:     Effort: Pulmonary effort is normal.     Breath sounds: Normal breath sounds. No wheezing, rhonchi or rales.  Abdominal:     General: Bowel sounds are normal.     Palpations: Abdomen is soft. There is no mass.     Tenderness: There is no abdominal tenderness. There is no guarding.  Musculoskeletal:        General: Normal range of motion.     Cervical back: Normal range of motion and neck supple.  Lymphadenopathy:     Cervical: No cervical adenopathy.  Skin:    General: Skin is warm and dry.  Neurological:     Mental Status: She is alert.     Wt Readings from Last 3 Encounters:  11/24/23 181 lb 6 oz (82.3 kg)  11/21/23 183 lb 6.4 oz (83.2 kg)  05/19/23 181 lb 9.6 oz (82.4 kg)    BP 124/82   Pulse 77   Ht 5' 4.5" (1.638 m)   Wt 181 lb 6 oz (82.3 kg)   SpO2 94%   BMI 30.65 kg/m   Assessment and Plan: 1. Pure hypercholesterolemia (Primary) Chronic.  Controlled.  Stable.  Patient returns and we are rechecking her lipid panel which was slightly elevated 2 years ago.  But is uncertain whether that was fasting or not.  We will check lipid panel for current level of LDL control but patient is likely not to be able to be put on statins because of issues when she tried it previously.  Patient has been given low-cholesterol low triglyceride dietary guidelines and we will proceed from here. - Comprehensive metabolic panel with GFR - Lipid Panel With LDL/HDL Ratio - Hemoglobin A1c  2. BMI 30.0-30.9,adult Patient is  concerned of about a BMI of this over 30 but there is no comorbid concerns at this time.  We will check her lipids and were also going to evaluate an atypical chest pain with activity tomorrow.  3. PAD (peripheral artery disease) (HCC) Patient is followed  by vein and vascular which she says she is significant occlusion of her peripheral vascular network.  Patient is not smoking and given this regard we will recheck her lipids today along with CMP fasting sugar and A1c that was near prediabetes in the past.  If any of these are concerns this may be able to put on something the lower blood sugar and perhaps lose weight.    Alayne Allis, MD

## 2023-11-25 ENCOUNTER — Ambulatory Visit (INDEPENDENT_AMBULATORY_CARE_PROVIDER_SITE_OTHER): Admitting: Family Medicine

## 2023-11-25 ENCOUNTER — Encounter: Payer: Self-pay | Admitting: Family Medicine

## 2023-11-25 ENCOUNTER — Ambulatory Visit: Payer: Self-pay | Admitting: Family Medicine

## 2023-11-25 VITALS — BP 128/84 | HR 80 | Resp 16 | Ht 64.5 in | Wt 181.0 lb

## 2023-11-25 DIAGNOSIS — R0609 Other forms of dyspnea: Secondary | ICD-10-CM

## 2023-11-25 DIAGNOSIS — R0789 Other chest pain: Secondary | ICD-10-CM | POA: Diagnosis not present

## 2023-11-25 DIAGNOSIS — R9431 Abnormal electrocardiogram [ECG] [EKG]: Secondary | ICD-10-CM

## 2023-11-25 DIAGNOSIS — S90811A Abrasion, right foot, initial encounter: Secondary | ICD-10-CM | POA: Diagnosis not present

## 2023-11-25 DIAGNOSIS — Z789 Other specified health status: Secondary | ICD-10-CM

## 2023-11-25 DIAGNOSIS — I739 Peripheral vascular disease, unspecified: Secondary | ICD-10-CM | POA: Diagnosis not present

## 2023-11-25 DIAGNOSIS — R079 Chest pain, unspecified: Secondary | ICD-10-CM

## 2023-11-25 LAB — COMPREHENSIVE METABOLIC PANEL WITH GFR
ALT: 29 IU/L (ref 0–32)
AST: 18 IU/L (ref 0–40)
Albumin: 4.5 g/dL (ref 3.9–4.9)
Alkaline Phosphatase: 75 IU/L (ref 44–121)
BUN/Creatinine Ratio: 18 (ref 12–28)
BUN: 15 mg/dL (ref 8–27)
Bilirubin Total: 0.4 mg/dL (ref 0.0–1.2)
CO2: 25 mmol/L (ref 20–29)
Calcium: 9.6 mg/dL (ref 8.7–10.3)
Chloride: 99 mmol/L (ref 96–106)
Creatinine, Ser: 0.82 mg/dL (ref 0.57–1.00)
Globulin, Total: 2.6 g/dL (ref 1.5–4.5)
Glucose: 87 mg/dL (ref 70–99)
Potassium: 4.7 mmol/L (ref 3.5–5.2)
Sodium: 139 mmol/L (ref 134–144)
Total Protein: 7.1 g/dL (ref 6.0–8.5)
eGFR: 77 mL/min/{1.73_m2} (ref >59)

## 2023-11-25 LAB — LIPID PANEL WITH LDL/HDL RATIO
Cholesterol, Total: 281 mg/dL — ABNORMAL HIGH (ref 100–199)
HDL: 60 mg/dL (ref >39)
LDL Chol Calc (NIH): 199 mg/dL — ABNORMAL HIGH (ref 0–99)
LDL/HDL Ratio: 3.3 ratio — ABNORMAL HIGH (ref 0.0–3.2)
Triglycerides: 121 mg/dL (ref 0–149)
VLDL Cholesterol Cal: 22 mg/dL (ref 5–40)

## 2023-11-25 LAB — HEMOGLOBIN A1C
Est. average glucose Bld gHb Est-mCnc: 111 mg/dL
Hgb A1c MFr Bld: 5.5 % (ref 4.8–5.6)

## 2023-11-25 MED ORDER — MUPIROCIN 2 % EX OINT
1.0000 | TOPICAL_OINTMENT | Freq: Two times a day (BID) | CUTANEOUS | 0 refills | Status: AC
Start: 2023-11-25 — End: ?

## 2023-11-25 NOTE — Progress Notes (Signed)
 Date:  11/25/2023   Name:  Tara Olson   DOB:  Jan 19, 1951   MRN:  [id number]   Chief Complaint: Chest Pain and Shortness of Breath  Chest Pain  This is a chronic problem. The current episode started more than 1 month ago. The onset quality is gradual. The problem occurs every several days. The pain is present in the substernal region. The pain is at a severity of 5/10. The pain is moderate. The quality of the pain is described as tightness. The pain radiates to the right jaw, mid back and left jaw. Associated symptoms include exertional chest pressure and shortness of breath. Pertinent negatives include no cough, diaphoresis, dizziness, fever, hemoptysis, irregular heartbeat, nausea, near-syncope, numbness, orthopnea, palpitations or PND. Past medical history comments: pad  Shortness of Breath This is a chronic problem. The current episode started more than 1 month ago. The problem occurs intermittently. Associated symptoms include chest pain and wheezing. Pertinent negatives include no fever, hemoptysis, leg swelling, orthopnea or PND. Risk factors include smoking. She has tried nothing for the symptoms.    Lab Results  Component Value Date   NA 139 11/24/2023   K 4.7 11/24/2023   CO2 25 11/24/2023   GLUCOSE 87 11/24/2023   BUN 15 11/24/2023   CREATININE 0.82 11/24/2023   CALCIUM 9.6 11/24/2023   EGFR 77 11/24/2023   GFRNONAA >60 12/29/2022   Lab Results  Component Value Date   CHOL 281 (H) 11/24/2023   HDL 60 11/24/2023   LDLCALC 199 (H) 11/24/2023   TRIG 121 11/24/2023   No results found for: "TSH" Lab Results  Component Value Date   HGBA1C 5.5 11/24/2023   Lab Results  Component Value Date   WBC 10.9 (H) 12/29/2022   HGB 12.2 12/29/2022   HCT 37.5 12/29/2022   MCV 93.8 12/29/2022   PLT 214 12/29/2022   Lab Results  Component Value Date   ALT 29 11/24/2023   AST 18 11/24/2023   ALKPHOS 75 11/24/2023   BILITOT 0.4 11/24/2023   No results found for:  "25OHVITD2", "25OHVITD3", "VD25OH"   Review of Systems  Constitutional:  Negative for diaphoresis and fever.  Respiratory:  Positive for chest tightness, shortness of breath and wheezing. Negative for apnea, cough, hemoptysis, choking and stridor.   Cardiovascular:  Positive for chest pain. Negative for palpitations, orthopnea, leg swelling, PND and near-syncope.  Gastrointestinal:  Negative for abdominal distention and nausea.  Neurological:  Negative for dizziness and numbness.    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   Primary osteoarthritis of right hip 03/31/2022   Tendinitis of upper biceps tendon of right shoulder 12/23/2021   Nontraumatic complete tear of right rotator cuff 10/28/2021   Rotator cuff tendinitis, right 10/28/2021   PVC's (premature ventricular contractions) 10/31/2020   Fibrocystic breast 08/06/2020   Closed fracture dislocation of right ankle with routine healing 07/20/2020   Former smoker 07/17/2020   Hearing loss 07/17/2020   Polyp of colon 07/17/2020   Snoring 07/17/2020   Screen for colon cancer 08/05/2015   Environmental allergies 05/19/2015    Allergies  Allergen Reactions   Hydrocodone Other (See Comments)    Hallucinations   Penicillins Swelling    Mouth swelling     Past Surgical History:  Procedure Laterality Date   BREAST BIOPSY Right 08/04/2020  Ultrasound biopsy   BREAST BIOPSY Right 05/31/2022   Right Breast distortion Ribbon Clip - path pending   BREAST BIOPSY Right 05/31/2022   MM RT BREAST BX W LOC DEV 1ST LESION IMAGE BX SPEC STEREO GUIDE 05/31/2022 [facility]   COLONOSCOPY WITH PROPOFOL  N/A 03/28/2023   Procedure: COLONOSCOPY WITH PROPOFOL  with polypectomy;  Surgeon: Marnee Sink, MD;  Location: [facility];  Service: Endoscopy;  Laterality: N/A;    EXPLORATORY LAPAROTOMY  1989   SHOULDER ARTHROSCOPY WITH SUBACROMIAL DECOMPRESSION, ROTATOR CUFF REPAIR AND BICEP TENDON REPAIR Right 12/22/2021   Procedure: RIGHT SHOULDER ARTHROSCOPY WITH DEBRIDEMENT, DECOMPRESSION, ROTATOR CUFF REPAIR, AND BICEPS TENODESIS;  Surgeon: Elner Hahn, MD;  Location: [facility];  Service: Orthopedics;  Laterality: Right;   TOTAL HIP ARTHROPLASTY Right 12/28/2022   Procedure: TOTAL HIP ARTHROPLASTY;  Surgeon: Elner Hahn, MD;  Location: [facility];  Service: Orthopedics;  Laterality: Right;    Social History   Tobacco Use   Smoking status: Former    Current packs/day: 0.00    Average packs/day: 1 pack/day for 35.0 years (35.0 ttl pk-yrs)    Types: Cigarettes    Start date: 07/05/1969    Quit date: 07/05/2004    Years since quitting: 19.4    Passive exposure: Past   Smokeless tobacco: Never  Vaping Use   Vaping status: Never Used  Substance Use Topics   Alcohol use: Yes    Alcohol/week: 3.0 standard drinks of alcohol    Types: 3 Glasses of wine per week    Comment: occ.   Drug use: Never     Medication list has been reviewed and updated.  No outpatient medications have been marked as taking for the 11/25/23 encounter (Office Visit) with Clarise Crooks, MD.       11/24/2023    9:06 AM 04/11/2023    1:32 PM 03/25/2023   10:59 AM 03/30/2022    9:40 AM  GAD 7 : Generalized Anxiety Score  Nervous, Anxious, on Edge 0 0 0 0  Control/stop worrying 0 0 0 0  Worry too much - different things 0 0 0 0  Trouble relaxing 0 0 0 0  Restless 0 0 0 0  Easily annoyed or irritable 0 0 0 0  Afraid - awful might happen 0 0 0 0  Total GAD 7 Score 0 0 0 0  Anxiety Difficulty Not difficult at all Not difficult at all Not difficult at all Not difficult at all       11/25/2023    8:51 AM 11/24/2023    9:06 AM 04/11/2023    1:32 PM  Depression screen PHQ 2/9  Decreased Interest 1 1 0  Down, Depressed, Hopeless 1 1 0  PHQ - 2 Score 2 2 0  Altered sleeping 0 0 0   Tired, decreased energy 3 3 0  Change in appetite 3 3 0  Feeling bad or failure about yourself  0 0 0  Trouble concentrating 0 0 0  Moving slowly or fidgety/restless 0 0 0  Suicidal thoughts 0 0 0  PHQ-9 Score 8 8 0  Difficult doing work/chores Somewhat difficult Somewhat difficult Not difficult at all    BP Readings from Last 3 Encounters:  11/25/23 128/84  11/24/23 124/82  11/21/23 (!) 149/86    Physical Exam Vitals and nursing note reviewed.  Neck:     Thyroid: No thyroid mass.     Vascular: No carotid bruit, hepatojugular reflux or JVD.  Cardiovascular:  Rate and Rhythm: Normal rate and regular rhythm.     Chest Wall: PMI is not displaced.     Pulses:          Carotid pulses are 1+ on the right side and 1+ on the left side.      Radial pulses are 1+ on the right side and 1+ on the left side.       Posterior tibial pulses are 1+ on the right side and 1+ on the left side.     Heart sounds: Normal heart sounds, S1 normal and S2 normal.     No systolic murmur is present.     No diastolic murmur is present.     No friction rub. No gallop. No S3 or S4 sounds.  Pulmonary:     Effort: No respiratory distress.     Breath sounds: No stridor. Wheezing present. No decreased breath sounds.  Abdominal:     Palpations: There is no hepatomegaly or splenomegaly.  Musculoskeletal:     Right lower leg: No edema.     Left lower leg: No edema.  Lymphadenopathy:     Cervical:     Right cervical: No superficial, deep or posterior cervical adenopathy.    Left cervical: No superficial, deep or posterior cervical adenopathy.     Wt Readings from Last 3 Encounters:  11/25/23 181 lb (82.1 kg)  11/24/23 181 lb 6 oz (82.3 kg)  11/21/23 183 lb 6.4 oz (83.2 kg)    BP 128/84   Pulse 80   Resp 16   Ht 5' 4.5" (1.638 m)   Wt 181 lb (82.1 kg)   SpO2 93%   BMI 30.59 kg/m   Assessment and Plan:  1. Other chest pain (Primary) New onset of exertional chest pain over the past year.   Patient has it when climbing stairs and walking distances.  It is substernal and in location and described as tightness.  It radiates to both jaw areas and perhaps to the mid back.  It only last for minutes and it is relieved by rest.  Patient does have significant peripheral vascular disease of the lower extremities and there is no reason to think that there is not similar concerns for the cardiopulmonary.  EKG was performed and read as follows sinus rhythm.  Rate 80.  Intervals normal.  No LVH H criteria met by voltage.  There is delay of R wave progression with low precordial lead voltage that may suggest that there is an old anterior MI.  We will refer to cardiology for evaluation of chest pain on exertion with risk factors including hypertension, hyperlipidemia, PAD atherosclerosis. - EKG 12-Lead  2. Dyspnea on exertion In addition to having substernal chest discomfort tightening patient is also having dyspnea on exertion which is relieved with rest. - Ambulatory referral to Pulmonology  3. Chest pain on exertion As noted above - Ambulatory referral to Cardiology  4. Abnormal EKG As noted above patient voltage of the precordial leads but there is a delay in R wave progression consistent with possible anterior ischemia. - Ambulatory referral to Cardiology  5. Abrasion of right heel, initial encounter Patient was trying to shave the bottom of her heel and cut herself there is no surrounding erythema no tenderness but we will treat with mupirocin ointment twice a day. - mupirocin ointment (BACTROBAN) 2 %; Apply 1 Application topically 2 (two) times daily.  Dispense: 22 g; Refill: 0  6. PAD (peripheral artery disease) (HCC) Patient also is  followed by vein and vascular for peripheral artery disease and will continue continue for follow-up at that medical establishment.  Patient has a statin intolerance and we are unable to use a statin for cholesterol control would appreciate any suggestions if  LDL remains elevated as previously seen.  7. Statin intolerance Patient has an intolerance of statins when she took it on past episode necessitating discontinuance   Alayne Allis, MD

## 2023-11-26 DIAGNOSIS — Z880 Allergy status to penicillin: Secondary | ICD-10-CM | POA: Diagnosis not present

## 2023-11-26 DIAGNOSIS — Z5941 Food insecurity: Secondary | ICD-10-CM | POA: Diagnosis not present

## 2023-11-26 DIAGNOSIS — E785 Hyperlipidemia, unspecified: Secondary | ICD-10-CM | POA: Diagnosis not present

## 2023-11-26 DIAGNOSIS — I739 Peripheral vascular disease, unspecified: Secondary | ICD-10-CM | POA: Diagnosis not present

## 2023-11-26 DIAGNOSIS — R079 Chest pain, unspecified: Secondary | ICD-10-CM | POA: Diagnosis not present

## 2023-11-26 DIAGNOSIS — Z885 Allergy status to narcotic agent status: Secondary | ICD-10-CM | POA: Diagnosis not present

## 2023-11-26 DIAGNOSIS — Z87891 Personal history of nicotine dependence: Secondary | ICD-10-CM | POA: Diagnosis not present

## 2023-11-26 DIAGNOSIS — R0602 Shortness of breath: Secondary | ICD-10-CM | POA: Diagnosis not present

## 2023-11-26 DIAGNOSIS — R072 Precordial pain: Secondary | ICD-10-CM | POA: Diagnosis not present

## 2023-11-27 DIAGNOSIS — R079 Chest pain, unspecified: Secondary | ICD-10-CM | POA: Diagnosis not present

## 2023-11-30 ENCOUNTER — Encounter: Payer: Self-pay | Admitting: Family Medicine

## 2023-11-30 ENCOUNTER — Ambulatory Visit: Payer: Self-pay

## 2023-11-30 NOTE — Telephone Encounter (Signed)
 Summary: urinary concern   Copied From CRM [id number]. Reason for Triage: The patient would like to speak with a member of clinical staff when possible about their ongoing urinary concerns (frequent urination, pressure when urinating and discoloration) that they've been previously seen/treated for  Please contact when possible          Chief Complaint: urinary symptoms Symptoms: frequency, bladder pressure, and flank pain  Frequency: constant Pertinent Negatives: Patient denies fever Disposition: [] ED /[] Urgent Care (no appt availability in office) / [] Appointment(In office/virtual)/ []  [facility]/ [] Home Care/ [] Refused Recommended Disposition /[] [facility]/ [x]  Follow-up with PCP Additional Notes: Pt reports urinary frequency x 5 days, seen in ED for another issue, but urine results were abnormal. Pt now endorsing bladder pressure and flank pain. HFU appt scheduled already for 5/29, pt asking if she can be started on antibiotics today for the discomfort.   Reason for Disposition  Urinating more frequently than usual (i.e., frequency)  Answer Assessment - Initial Assessment Questions 1. SYMPTOM: "What's the main symptom you're concerned about?" (e.g., frequency, incontinence)     Urinary frequency,   2. ONSET: "When did the  urinary symptoms  start?"     4-5 days ago, worsening last night   3. PAIN: "Is there any pain?" If Yes, ask: "How bad is it?" (Scale: 1-10; mild, moderate, severe)     Mild  4. CAUSE: "What do you think is causing the symptoms?"     Concern for UTI  5. OTHER SYMPTOMS: "Do you have any other symptoms?" (e.g., blood in urine, fever, flank pain, pain with urination)     pressure in bladder, low back pain  6. PREGNANCY: "Is there any chance you are pregnant?" "When was your last menstrual period?"     no  Protocols used: Urinary Symptoms-A-AH

## 2023-11-30 NOTE — Telephone Encounter (Signed)
Noted  Pt has an appt  KP 

## 2023-12-01 ENCOUNTER — Encounter: Payer: Self-pay | Admitting: Family Medicine

## 2023-12-01 ENCOUNTER — Ambulatory Visit (INDEPENDENT_AMBULATORY_CARE_PROVIDER_SITE_OTHER): Admitting: Family Medicine

## 2023-12-01 VITALS — BP 124/80 | HR 83 | Ht 64.5 in | Wt 181.0 lb

## 2023-12-01 DIAGNOSIS — R3915 Urgency of urination: Secondary | ICD-10-CM

## 2023-12-01 DIAGNOSIS — R600 Localized edema: Secondary | ICD-10-CM | POA: Diagnosis not present

## 2023-12-01 DIAGNOSIS — Z8249 Family history of ischemic heart disease and other diseases of the circulatory system: Secondary | ICD-10-CM | POA: Diagnosis not present

## 2023-12-01 DIAGNOSIS — N3090 Cystitis, unspecified without hematuria: Secondary | ICD-10-CM

## 2023-12-01 DIAGNOSIS — R0789 Other chest pain: Secondary | ICD-10-CM | POA: Diagnosis not present

## 2023-12-01 DIAGNOSIS — R0602 Shortness of breath: Secondary | ICD-10-CM | POA: Diagnosis not present

## 2023-12-01 LAB — POCT URINALYSIS DIPSTICK
Bilirubin, UA: NEGATIVE
Glucose, UA: NEGATIVE
Ketones, UA: NEGATIVE
Nitrite, UA: NEGATIVE
Protein, UA: NEGATIVE
Spec Grav, UA: 1.015 (ref 1.010–1.025)
Urobilinogen, UA: 0.2 U/dL
pH, UA: 5 (ref 5.0–8.0)

## 2023-12-01 MED ORDER — NITROFURANTOIN MONOHYD MACRO 100 MG PO CAPS: 100.0000 mg | ORAL_CAPSULE | Freq: Two times a day (BID) | ORAL | 0 refills | Status: AC

## 2023-12-01 NOTE — Progress Notes (Signed)
 Date:  12/01/2023   Name:  Tara Olson   DOB:  December 06, 1950   MRN:  [id number]   Chief Complaint: Urinary Tract Infection (Urgency x 3 days. )  Urinary Tract Infection  This is a new problem. The current episode started in the past 7 days. The problem has been gradually worsening. The pain is mild. Associated symptoms include frequency and urgency. Pertinent negatives include no chills, discharge, flank pain, hematuria or hesitancy. She has tried nothing for the symptoms. The treatment provided moderate relief.    Lab Results  Component Value Date   NA 139 11/24/2023   K 4.7 11/24/2023   CO2 25 11/24/2023   GLUCOSE 87 11/24/2023   BUN 15 11/24/2023   CREATININE 0.82 11/24/2023   CALCIUM 9.6 11/24/2023   EGFR 77 11/24/2023   GFRNONAA >60 12/29/2022   Lab Results  Component Value Date   CHOL 281 (H) 11/24/2023   HDL 60 11/24/2023   LDLCALC 199 (H) 11/24/2023   TRIG 121 11/24/2023   No results found for: "TSH" Lab Results  Component Value Date   HGBA1C 5.5 11/24/2023   Lab Results  Component Value Date   WBC 10.9 (H) 12/29/2022   HGB 12.2 12/29/2022   HCT 37.5 12/29/2022   MCV 93.8 12/29/2022   PLT 214 12/29/2022   Lab Results  Component Value Date   ALT 29 11/24/2023   AST 18 11/24/2023   ALKPHOS 75 11/24/2023   BILITOT 0.4 11/24/2023   No results found for: "25OHVITD2", "25OHVITD3", "VD25OH"   Review of Systems  Constitutional:  Negative for chills.  Genitourinary:  Positive for frequency and urgency. Negative for flank pain, hematuria and hesitancy.    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   Primary osteoarthritis of right hip 03/31/2022   Tendinitis of upper biceps tendon of right shoulder 12/23/2021   Nontraumatic complete tear of right rotator cuff 10/28/2021   Rotator cuff tendinitis,  right 10/28/2021   PVC's (premature ventricular contractions) 10/31/2020   Fibrocystic breast 08/06/2020   Closed fracture dislocation of right ankle with routine healing 07/20/2020   Former smoker 07/17/2020   Hearing loss 07/17/2020   Polyp of colon 07/17/2020   Snoring 07/17/2020   Screen for colon cancer 08/05/2015   Environmental allergies 05/19/2015    Allergies  Allergen Reactions   Hydrocodone Other (See Comments)    Hallucinations   Penicillins Swelling    Mouth swelling     Past Surgical History:  Procedure Laterality Date   BREAST BIOPSY Right 08/04/2020   Ultrasound biopsy   BREAST BIOPSY Right 05/31/2022   Right Breast distortion Ribbon Clip - path pending   BREAST BIOPSY Right 05/31/2022   MM RT BREAST BX W LOC DEV 1ST LESION IMAGE BX SPEC STEREO GUIDE 05/31/2022 [facility]   COLONOSCOPY WITH PROPOFOL  N/A 03/28/2023   Procedure: COLONOSCOPY WITH PROPOFOL  with polypectomy;  Surgeon: Marnee Sink, MD;  Location: [facility];  Service: Endoscopy;  Laterality: N/A;   EXPLORATORY LAPAROTOMY  1989   SHOULDER ARTHROSCOPY WITH SUBACROMIAL DECOMPRESSION, ROTATOR CUFF REPAIR AND BICEP TENDON REPAIR Right 12/22/2021   Procedure: RIGHT SHOULDER ARTHROSCOPY WITH DEBRIDEMENT, DECOMPRESSION, ROTATOR CUFF REPAIR, AND BICEPS TENODESIS;  Surgeon: Elner Hahn, MD;  Location: [facility];  Service: Orthopedics;  Laterality: Right;   TOTAL HIP ARTHROPLASTY Right 12/28/2022  Procedure: TOTAL HIP ARTHROPLASTY;  Surgeon: Elner Hahn, MD;  Location: [facility];  Service: Orthopedics;  Laterality: Right;    Social History   Tobacco Use   Smoking status: Former    Current packs/day: 0.00    Average packs/day: 1 pack/day for 35.0 years (35.0 ttl pk-yrs)    Types: Cigarettes    Start date: 07/05/1969    Quit date: 07/05/2004    Years since quitting: 19.4    Passive exposure: Past   Smokeless tobacco: Never  Vaping Use   Vaping status: Never Used  Substance Use Topics    Alcohol use: Yes    Alcohol/week: 3.0 standard drinks of alcohol    Types: 3 Glasses of wine per week    Comment: occ.   Drug use: Never     Medication list has been reviewed and updated.  No outpatient medications have been marked as taking for the 12/01/23 encounter (Office Visit) with Clarise Crooks, MD.       12/01/2023    3:53 PM 11/24/2023    9:06 AM 04/11/2023    1:32 PM 03/25/2023   10:59 AM  GAD 7 : Generalized Anxiety Score  Nervous, Anxious, on Edge 0 0 0 0  Control/stop worrying 0 0 0 0  Worry too much - different things 0 0 0 0  Trouble relaxing 0 0 0 0  Restless 0 0 0 0  Easily annoyed or irritable 0 0 0 0  Afraid - awful might happen 0 0 0 0  Total GAD 7 Score 0 0 0 0  Anxiety Difficulty Not difficult at all Not difficult at all Not difficult at all Not difficult at all       12/01/2023    3:53 PM 11/25/2023    8:51 AM 11/24/2023    9:06 AM  Depression screen PHQ 2/9  Decreased Interest 0 1 1  Down, Depressed, Hopeless 0 1 1  PHQ - 2 Score 0 2 2  Altered sleeping 0 0 0  Tired, decreased energy 1 3 3   Change in appetite 0 3 3  Feeling bad or failure about yourself  0 0 0  Trouble concentrating 0 0 0  Moving slowly or fidgety/restless 0 0 0  Suicidal thoughts 0 0 0  PHQ-9 Score 1 8 8   Difficult doing work/chores Not difficult at all Somewhat difficult Somewhat difficult    BP Readings from Last 3 Encounters:  12/01/23 124/80  11/25/23 128/84  11/24/23 124/82    Physical Exam Cardiovascular:     Pulses: Normal pulses.     Heart sounds: S1 normal and S2 normal.     No systolic murmur is present.     No diastolic murmur is present.     No S3 or S4 sounds.  Pulmonary:     Breath sounds: No decreased breath sounds, wheezing, rhonchi or rales.  Abdominal:     Palpations: There is no hepatomegaly or splenomegaly.     Tenderness: There is abdominal tenderness in the suprapubic area. There is no right CVA tenderness or left CVA tenderness.     Wt  Readings from Last 3 Encounters:  12/01/23 181 lb (82.1 kg)  11/25/23 181 lb (82.1 kg)  11/24/23 181 lb 6 oz (82.3 kg)    BP 124/80   Pulse 83   Ht 5' 4.5" (1.638 m)   Wt 181 lb (82.1 kg)   SpO2 94%   BMI 30.59 kg/m   Assessment and Plan:  1.  Urgency of urination (Primary) New onset.  Persistent.  Patient was seen in an ER setting in [facility] other than frequency she had no other symptomatology such as suprapubic discomfort or dysuria.  Urinalysis on review saw leukocytes but culture grew out mixed flora and she was not treated for UTI since then patient has further developed frequency urgency dysuria see in discomfort in the suprapubic area consistent with UTI and we will go ahead and treat patient was treated with Macrobid  twice a day for 7 days in September of last year and will repeat that given that patient is allergic to penicillin.  We will send urine culture for results evaluation and verification of sensitivity.  Will recheck patient on as-needed basis - POCT urinalysis dipstick - Urine Culture  2. Cystitis See above as noted patient was started on nitrofurantoin  100 mg twice a day for 7 days with culture pending. - nitrofurantoin , macrocrystal-monohydrate, (MACROBID ) 100 MG capsule; Take 1 capsule (100 mg total) by mouth 2 (two) times daily for 7 days.  Dispense: 14 capsule; Refill: 0    Alayne Allis, MD

## 2023-12-02 ENCOUNTER — Encounter (INDEPENDENT_AMBULATORY_CARE_PROVIDER_SITE_OTHER): Payer: Self-pay | Admitting: Vascular Surgery

## 2023-12-02 DIAGNOSIS — I70219 Atherosclerosis of native arteries of extremities with intermittent claudication, unspecified extremity: Secondary | ICD-10-CM | POA: Insufficient documentation

## 2023-12-02 DIAGNOSIS — J449 Chronic obstructive pulmonary disease, unspecified: Secondary | ICD-10-CM | POA: Insufficient documentation

## 2023-12-02 NOTE — Progress Notes (Signed)
 MRN : [id number]  Tara Olson is a 73 y.o. (1951-04-16) female who presents with chief complaint of check circulation.  History of Present Illness:   The patient returns to the office for followup regarding atherosclerotic changes of the lower extremities and review of the noninvasive studies.   There have been no interval changes in lower extremity symptoms. No interval shortening of the patient's claudication distance or development of rest pain symptoms. No new ulcers or wounds have occurred since the last visit.  There have been no significant changes to the patient's overall health care.  The patient denies amaurosis fugax or recent TIA symptoms. There are no documented recent neurological changes noted. There is no history of DVT, PE or superficial thrombophlebitis. The patient denies recent episodes of angina or shortness of breath.   ABI Rt=0.75 and Lt=0.70  (previous ABI's Rt=0.72 and Lt=0.76) Duplex ultrasound of the bilateral lower extremity arterial system shows occlusion of the mid to distal SFA on the right and left lower extremities  Current Meds  Medication Sig   acetaminophen  (TYLENOL ) 325 MG tablet Take 650 mg by mouth every 6 (six) hours as needed.   acetaminophen  (TYLENOL ) 500 MG tablet Take 2 tablets (1,000 mg total) by mouth every 6 (six) hours. (Patient not taking: Reported on 11/24/2023)   albuterol  (VENTOLIN  HFA) 108 (90 Base) MCG/ACT inhaler INHALE 2 PUFFS INTO THE LUNGS EVERY 4 HOURS AS NEEDED   Biotin 5000 MCG CAPS Take by mouth.   Calcium Carbonate-Vit D-Min (CALCIUM 1200 PO) Take by mouth.   cyanocobalamin (VITAMIN B12) 1000 MCG tablet Take 1,000 mcg by mouth daily.   Multiple Vitamin (MULTIVITAMIN) capsule Take 1 capsule by mouth daily.   Na Sulfate-K Sulfate-Mg Sulf 17.5-3.13-1.6 GM/177ML SOLN MIX AND DRINK AS DIRECTED    Past Medical History:  Diagnosis Date   Arthritis     joints, hands   Depression    Dyspnea    GERD (gastroesophageal reflux disease)    HOH (hard of hearing)    Wears aides bilateral   Sleep apnea    Uses CPAP    Past Surgical History:  Procedure Laterality Date   BREAST BIOPSY Right 08/04/2020   Ultrasound biopsy   BREAST BIOPSY Right 05/31/2022   Right Breast distortion Ribbon Clip - path pending   BREAST BIOPSY Right 05/31/2022   MM RT BREAST BX W LOC DEV 1ST LESION IMAGE BX SPEC STEREO GUIDE 05/31/2022 [facility]   COLONOSCOPY WITH PROPOFOL  N/A 03/28/2023   Procedure: COLONOSCOPY WITH PROPOFOL  with polypectomy;  Surgeon: Marnee Sink, MD;  Location: [facility];  Service: Endoscopy;  Laterality: N/A;   EXPLORATORY LAPAROTOMY  1989   SHOULDER ARTHROSCOPY WITH SUBACROMIAL DECOMPRESSION, ROTATOR CUFF REPAIR AND BICEP TENDON REPAIR Right 12/22/2021   Procedure: RIGHT SHOULDER ARTHROSCOPY WITH DEBRIDEMENT, DECOMPRESSION, ROTATOR CUFF REPAIR, AND BICEPS TENODESIS;  Surgeon: Elner Hahn, MD;  Location: [facility];  Service: Orthopedics;  Laterality: Right;   TOTAL HIP ARTHROPLASTY Right 12/28/2022   Procedure: TOTAL HIP ARTHROPLASTY;  Surgeon: Elner Hahn, MD;  Location: [facility];  Service: Orthopedics;  Laterality: Right;  Social History Social History   Tobacco Use   Smoking status: Former    Current packs/day: 0.00    Average packs/day: 1 pack/day for 35.0 years (35.0 ttl pk-yrs)    Types: Cigarettes    Start date: 07/05/1969    Quit date: 07/05/2004    Years since quitting: 19.4    Passive exposure: Past   Smokeless tobacco: Never  Vaping Use   Vaping status: Never Used  Substance Use Topics   Alcohol use: Yes    Alcohol/week: 3.0 standard drinks of alcohol    Types: 3 Glasses of wine per week    Comment: occ.   Drug use: Never    Family History Family History  Problem Relation Age of Onset   Colon cancer Mother    Diabetes Father    Hypertension Father    Heart disease Father    Heart disease  Brother    Breast cancer Maternal Aunt     Allergies  Allergen Reactions   Hydrocodone Other (See Comments)    Hallucinations   Penicillins Swelling    Mouth swelling      REVIEW OF SYSTEMS (Negative unless checked)  Constitutional: [] Weight loss  [] Fever  [] Chills Cardiac: [] Chest pain   [] Chest pressure   [] Palpitations   [] Shortness of breath when laying flat   [] Shortness of breath with exertion. Vascular:  [x] Pain in legs with walking   [] Pain in legs at rest  [] History of DVT   [] Phlebitis   [] Swelling in legs   [] Varicose veins   [] Non-healing ulcers Pulmonary:   [] Uses home oxygen   [] Productive cough   [] Hemoptysis   [] Wheeze  [] COPD   [] Asthma Neurologic:  [] Dizziness   [] Seizures   [] History of stroke   [] History of TIA  [] Aphasia   [] Vissual changes   [] Weakness or numbness in arm   [] Weakness or numbness in leg Musculoskeletal:   [] Joint swelling   [] Joint pain   [] Low back pain Hematologic:  [] Easy bruising  [] Easy bleeding   [] Hypercoagulable state   [] Anemic Gastrointestinal:  [] Diarrhea   [] Vomiting  [] Gastroesophageal reflux/heartburn   [] Difficulty swallowing. Genitourinary:  [] Chronic kidney disease   [] Difficult urination  [] Frequent urination   [] Blood in urine Skin:  [] Rashes   [] Ulcers  Psychological:  [] History of anxiety   []  History of major depression.  Physical Examination  Vitals:   11/21/23 1341  BP: (!) 149/86  Pulse: 69  Resp: 18  Weight: 183 lb 6.4 oz (83.2 kg)  Height: 5' 4.5" (1.638 m)   Body mass index is 30.99 kg/m. Gen: WD/WN, NAD Head: [place]/AT, No temporalis wasting.  Ear/Nose/Throat: Hearing grossly intact, nares w/o erythema or drainage Eyes: PER, EOMI, sclera nonicteric.  Neck: Supple, no masses.  No bruit or JVD.  Pulmonary:  Good air movement, no audible wheezing, no use of accessory muscles.  Cardiac: RRR, normal S1, S2, no Murmurs. Vascular:  mild trophic changes, no open wounds Vessel Right Left  Radial Palpable Palpable   PT Not Palpable Not Palpable  DP Not Palpable Not Palpable  Gastrointestinal: soft, non-distended. No guarding/no peritoneal signs.  Musculoskeletal: M/S 5/5 throughout.  No visible deformity.  Neurologic: CN 2-12 intact. Pain and light touch intact in extremities.  Symmetrical.  Speech is fluent. Motor exam as listed above. Psychiatric: Judgment intact, Mood & affect appropriate for pt's clinical situation. Dermatologic: No rashes or ulcers noted.  No changes consistent with cellulitis.   CBC Lab Results  Component Value Date   WBC  10.9 (H) 12/29/2022   HGB 12.2 12/29/2022   HCT 37.5 12/29/2022   MCV 93.8 12/29/2022   PLT 214 12/29/2022    BMET    Component Value Date/Time   NA 139 11/24/2023 1004   K 4.7 11/24/2023 1004   CL 99 11/24/2023 1004   CO2 25 11/24/2023 1004   GLUCOSE 87 11/24/2023 1004   GLUCOSE 159 (H) 12/29/2022 0602   BUN 15 11/24/2023 1004   CREATININE 0.82 11/24/2023 1004   CALCIUM 9.6 11/24/2023 1004   GFRNONAA >60 12/29/2022 0602   Estimated Creatinine Clearance: 67.3 mL/min (by C-G formula based on SCr of 0.82 mg/dL).  COAG No results found for: "INR", "PROTIME"  Radiology VAS US  ABI WITH/WO TBI Result Date: 11/21/2023  LOWER EXTREMITY DOPPLER STUDY Patient Name:  Tara Olson  Date of Exam:   11/21/2023 Medical Rec #: [id number]         Accession #:    [id number] Date of Birth: Feb 19, 1951          Patient Gender: F Patient Age:   34 years Exam Location:  [facility] Procedure:      VAS US  ABI WITH/WO TBI Referring Phys: Sharla Davis --------------------------------------------------------------------------------  Indications: Abnormal Home Health ABI screening High Risk Factors: Past history of smoking.  Performing Technologist: Oneta Bilberry RVT  Examination Guidelines: A complete evaluation includes at minimum, Doppler waveform signals and systolic blood pressure reading at the level of bilateral brachial, anterior tibial, and posterior  tibial arteries, when vessel segments are accessible. Bilateral testing is considered an integral part of a complete examination. Photoelectric Plethysmograph (PPG) waveforms and toe systolic pressure readings are included as required and additional duplex testing as needed. Limited examinations for reoccurring indications may be performed as noted.  ABI Findings: +---------+------------------+-----+----------+--------+ Right    Rt Pressure (mmHg)IndexWaveform  Comment  +---------+------------------+-----+----------+--------+ Brachial 162                                       +---------+------------------+-----+----------+--------+ PTA      121               0.75 monophasic         +---------+------------------+-----+----------+--------+ DP       118               0.73 monophasic         +---------+------------------+-----+----------+--------+ Great Toe81                0.50                    +---------+------------------+-----+----------+--------+ +---------+------------------+-----+----------+-------+ Left     Lt Pressure (mmHg)IndexWaveform  Comment +---------+------------------+-----+----------+-------+ Brachial 158                                      +---------+------------------+-----+----------+-------+ PTA      114               0.70 monophasic        +---------+------------------+-----+----------+-------+ DP       113               0.70 monophasic        +---------+------------------+-----+----------+-------+ Great Toe71                0.44                   +---------+------------------+-----+----------+-------+ +-------+-----------+-----------+------------+------------+  ABI/TBIToday's ABIToday's TBIPrevious ABIPrevious TBI +-------+-----------+-----------+------------+------------+ Right  0.75       0.50       0.72        0.41         +-------+-----------+-----------+------------+------------+ Left   0.70       0.44       0.76         0.65         +-------+-----------+-----------+------------+------------+  Bilateral ABIs appear essentially unchanged compared to prior study on 05/19/2023.  Summary: Right: Resting right ankle-brachial index indicates moderate right lower extremity arterial disease. The right toe-brachial index is abnormal. Left: Resting left ankle-brachial index indicates moderate left lower extremity arterial disease. The left toe-brachial index is abnormal. *See table(s) above for measurements and observations.  Electronically signed by Devon Fogo MD on 11/21/2023 at 4:58:19 PM.    Final    VAS US  LOWER EXTREMITY ARTERIAL DUPLEX Result Date: 11/21/2023 LOWER EXTREMITY ARTERIAL DUPLEX STUDY Patient Name:  Tara Olson  Date of Exam:   11/21/2023 Medical Rec #: [id number]         Accession #:    [id number] Date of Birth: [birth year]          Patient Gender: F Patient Age:   7 years Exam Location:  [facility] Procedure:      VAS US  LOWER EXTREMITY ARTERIAL DUPLEX Referring Phys: Sharla Davis --------------------------------------------------------------------------------  Indications: Peripheral artery disease. High Risk Factors: Past history of smoking.  Current ABI: Right: 0.75, Left: 0.70 Performing Technologist: Oneta Bilberry RVT  Examination Guidelines: A complete evaluation includes B-mode imaging, spectral Doppler, color Doppler, and power Doppler as needed of all accessible portions of each vessel. Bilateral testing is considered an integral part of a complete examination. Limited examinations for reoccurring indications may be performed as noted.  +----------+--------+-----+--------+----------+--------+ RIGHT     PSV cm/sRatioStenosisWaveform  Comments +----------+--------+-----+--------+----------+--------+ CFA Distal103                  triphasic          +----------+--------+-----+--------+----------+--------+ DFA       79                   biphasic            +----------+--------+-----+--------+----------+--------+ SFA Prox  49                   monophasic         +----------+--------+-----+--------+----------+--------+ SFA Mid   0            occluded                   +----------+--------+-----+--------+----------+--------+ SFA Distal0            occluded                   +----------+--------+-----+--------+----------+--------+ POP Prox  49                   monophasic         +----------+--------+-----+--------+----------+--------+ POP Distal38                   monophasic         +----------+--------+-----+--------+----------+--------+ ATA Distal22                   monophasic         +----------+--------+-----+--------+----------+--------+ PTA Distal45  monophasic         +----------+--------+-----+--------+----------+--------+ A focal velocity elevation of 0 cm/s was obtained at Mid to distal SFA. Findings are characteristic of occluded.  +----------+--------+-----+---------------+----------+--------+ LEFT      PSV cm/sRatioStenosis       Waveform  Comments +----------+--------+-----+---------------+----------+--------+ CFA Distal100                         biphasic           +----------+--------+-----+---------------+----------+--------+ DFA       100                         biphasic           +----------+--------+-----+---------------+----------+--------+ SFA Prox  140          50-74% stenosisbiphasic           +----------+--------+-----+---------------+----------+--------+ SFA Mid   0            occluded                          +----------+--------+-----+---------------+----------+--------+ SFA Distal0            occluded                          +----------+--------+-----+---------------+----------+--------+ POP Prox  61                          monophasic         +----------+--------+-----+---------------+----------+--------+ POP Distal35                           monophasic         +----------+--------+-----+---------------+----------+--------+ ATA Distal40                          monophasic         +----------+--------+-----+---------------+----------+--------+ PTA Distal48                          monophasic         +----------+--------+-----+---------------+----------+--------+ A focal velocity elevation of 140 cm/s was obtained at Prx SFA with a VR of 2.9. Findings are characteristic of 50-74% stenosis. A 2nd focal velocity elevation was visualized, measuring 0 cm/s at Mid to Distal SFA. Findings are characteristic of total occlusion.  Summary: Right: Total occlusion noted in the superficial femoral artery. Left: Total occlusion noted in the superficial femoral artery.  See table(s) above for measurements and observations. Electronically signed by Devon Fogo MD on 11/21/2023 at 4:57:49 PM.    Final      Assessment/Plan 1. Atherosclerosis of native artery of both lower extremities with intermittent claudication (HCC) (Primary)  Recommend:  The patient has evidence of atherosclerosis of the lower extremities with claudication.  The patient does not voice lifestyle limiting changes at this point in time.  Noninvasive studies do not suggest clinically significant change.  No invasive studies, angiography or surgery at this time The patient should continue walking and begin a more formal exercise program.  The patient should continue antiplatelet therapy and aggressive treatment of the lipid abnormalities  No changes in the patient's medications at this time  Continued surveillance is indicated as atherosclerosis is likely to progress with time.    The patient  will continue follow up with noninvasive studies as ordered.  - VAS US  ABI WITH/WO TBI; Future  2. Pulmonary emphysema, unspecified emphysema type (HCC) Continue pulmonary medications and aerosols as already ordered, these medications have been reviewed  and there are no changes at this time.      Devon Fogo, MD  12/02/2023 5:39 PM

## 2023-12-03 LAB — URINE CULTURE

## 2023-12-05 ENCOUNTER — Ambulatory Visit: Payer: Self-pay | Admitting: Internal Medicine

## 2023-12-05 DIAGNOSIS — F17218 Nicotine dependence, cigarettes, with other nicotine-induced disorders: Secondary | ICD-10-CM | POA: Diagnosis not present

## 2023-12-05 DIAGNOSIS — R0602 Shortness of breath: Secondary | ICD-10-CM | POA: Diagnosis not present

## 2023-12-20 DIAGNOSIS — R0602 Shortness of breath: Secondary | ICD-10-CM | POA: Diagnosis not present

## 2023-12-20 DIAGNOSIS — I361 Nonrheumatic tricuspid (valve) insufficiency: Secondary | ICD-10-CM | POA: Diagnosis not present

## 2023-12-20 DIAGNOSIS — R600 Localized edema: Secondary | ICD-10-CM | POA: Diagnosis not present

## 2023-12-20 DIAGNOSIS — I3481 Nonrheumatic mitral (valve) annulus calcification: Secondary | ICD-10-CM | POA: Diagnosis not present

## 2023-12-20 DIAGNOSIS — I351 Nonrheumatic aortic (valve) insufficiency: Secondary | ICD-10-CM | POA: Diagnosis not present

## 2023-12-20 DIAGNOSIS — R0600 Dyspnea, unspecified: Secondary | ICD-10-CM | POA: Diagnosis not present

## 2023-12-20 DIAGNOSIS — I340 Nonrheumatic mitral (valve) insufficiency: Secondary | ICD-10-CM | POA: Diagnosis not present

## 2023-12-21 DIAGNOSIS — R0602 Shortness of breath: Secondary | ICD-10-CM | POA: Diagnosis not present

## 2023-12-21 DIAGNOSIS — I2510 Atherosclerotic heart disease of native coronary artery without angina pectoris: Secondary | ICD-10-CM | POA: Diagnosis not present

## 2023-12-21 DIAGNOSIS — R0789 Other chest pain: Secondary | ICD-10-CM | POA: Diagnosis not present

## 2023-12-21 DIAGNOSIS — R931 Abnormal findings on diagnostic imaging of heart and coronary circulation: Secondary | ICD-10-CM | POA: Diagnosis not present

## 2023-12-26 DIAGNOSIS — J439 Emphysema, unspecified: Secondary | ICD-10-CM | POA: Diagnosis not present

## 2023-12-26 DIAGNOSIS — R0602 Shortness of breath: Secondary | ICD-10-CM | POA: Diagnosis not present

## 2023-12-30 DIAGNOSIS — I10 Essential (primary) hypertension: Secondary | ICD-10-CM | POA: Diagnosis not present

## 2023-12-30 DIAGNOSIS — E785 Hyperlipidemia, unspecified: Secondary | ICD-10-CM | POA: Diagnosis not present

## 2023-12-30 DIAGNOSIS — I2510 Atherosclerotic heart disease of native coronary artery without angina pectoris: Secondary | ICD-10-CM | POA: Diagnosis not present

## 2023-12-30 DIAGNOSIS — R079 Chest pain, unspecified: Secondary | ICD-10-CM | POA: Diagnosis not present

## 2023-12-30 DIAGNOSIS — I739 Peripheral vascular disease, unspecified: Secondary | ICD-10-CM | POA: Diagnosis not present

## 2023-12-30 DIAGNOSIS — R030 Elevated blood-pressure reading, without diagnosis of hypertension: Secondary | ICD-10-CM | POA: Diagnosis not present

## 2023-12-30 DIAGNOSIS — E7849 Other hyperlipidemia: Secondary | ICD-10-CM | POA: Diagnosis not present

## 2024-11-19 ENCOUNTER — Ambulatory Visit (INDEPENDENT_AMBULATORY_CARE_PROVIDER_SITE_OTHER): Admitting: Vascular Surgery

## 2024-11-19 ENCOUNTER — Encounter (INDEPENDENT_AMBULATORY_CARE_PROVIDER_SITE_OTHER)
